# Patient Record
Sex: Male | Born: 1945 | Race: White | Hispanic: No | Marital: Married | Smoking: Never smoker
Health system: Southern US, Community
[De-identification: ages and names within clinical notes are randomized; demographics above are authoritative.]

## PROBLEM LIST (undated history)

## (undated) DIAGNOSIS — E1140 Type 2 diabetes mellitus with diabetic neuropathy, unspecified: Secondary | ICD-10-CM

## (undated) DIAGNOSIS — I739 Peripheral vascular disease, unspecified: Secondary | ICD-10-CM

## (undated) DIAGNOSIS — I10 Essential (primary) hypertension: Secondary | ICD-10-CM

## (undated) DIAGNOSIS — Z952 Presence of prosthetic heart valve: Secondary | ICD-10-CM

## (undated) DIAGNOSIS — M109 Gout, unspecified: Secondary | ICD-10-CM

## (undated) DIAGNOSIS — I447 Left bundle-branch block, unspecified: Secondary | ICD-10-CM

## (undated) DIAGNOSIS — I712 Thoracic aortic aneurysm, without rupture, unspecified: Secondary | ICD-10-CM

## (undated) DIAGNOSIS — E785 Hyperlipidemia, unspecified: Secondary | ICD-10-CM

## (undated) DIAGNOSIS — E11319 Type 2 diabetes mellitus with unspecified diabetic retinopathy without macular edema: Secondary | ICD-10-CM

## (undated) DIAGNOSIS — C189 Malignant neoplasm of colon, unspecified: Secondary | ICD-10-CM

## (undated) DIAGNOSIS — E611 Iron deficiency: Secondary | ICD-10-CM

## (undated) DIAGNOSIS — I779 Disorder of arteries and arterioles, unspecified: Secondary | ICD-10-CM

## (undated) DIAGNOSIS — I629 Nontraumatic intracranial hemorrhage, unspecified: Secondary | ICD-10-CM

## (undated) DIAGNOSIS — N189 Chronic kidney disease, unspecified: Secondary | ICD-10-CM

## (undated) DIAGNOSIS — E119 Type 2 diabetes mellitus without complications: Secondary | ICD-10-CM

## (undated) DIAGNOSIS — N200 Calculus of kidney: Secondary | ICD-10-CM

## (undated) DIAGNOSIS — D509 Iron deficiency anemia, unspecified: Secondary | ICD-10-CM

## (undated) DIAGNOSIS — N289 Disorder of kidney and ureter, unspecified: Secondary | ICD-10-CM

## (undated) DIAGNOSIS — R55 Syncope and collapse: Secondary | ICD-10-CM

## (undated) HISTORY — PX: CARDIAC VALVE REPLACEMENT: SHX585

## (undated) HISTORY — DX: Iron deficiency anemia, unspecified: D50.9

## (undated) HISTORY — DX: Type 2 diabetes mellitus with unspecified diabetic retinopathy without macular edema: E11.319

## (undated) HISTORY — DX: Syncope and collapse: R55

## (undated) HISTORY — DX: Chronic kidney disease, unspecified: N18.9

## (undated) HISTORY — DX: Disorder of arteries and arterioles, unspecified: I77.9

## (undated) HISTORY — DX: Left bundle-branch block, unspecified: I44.7

## (undated) HISTORY — DX: Thoracic aortic aneurysm, without rupture: I71.2

## (undated) HISTORY — DX: Nontraumatic intracranial hemorrhage, unspecified: I62.9

## (undated) HISTORY — DX: Hyperlipidemia, unspecified: E78.5

## (undated) HISTORY — DX: Thoracic aortic aneurysm, without rupture, unspecified: I71.20

## (undated) HISTORY — DX: Calculus of kidney: N20.0

## (undated) HISTORY — PX: EYE SURGERY: SHX253

## (undated) HISTORY — DX: Malignant neoplasm of colon, unspecified: C18.9

## (undated) HISTORY — PX: PARTIAL COLECTOMY: SHX5273

## (undated) HISTORY — DX: Type 2 diabetes mellitus with diabetic neuropathy, unspecified: E11.40

## (undated) HISTORY — DX: Peripheral vascular disease, unspecified: I73.9

---

## 2013-05-23 ENCOUNTER — Emergency Department: Payer: Self-pay | Admitting: Emergency Medicine

## 2013-05-23 LAB — BASIC METABOLIC PANEL
Chloride: 105 mmol/L (ref 98–107)
EGFR (Non-African Amer.): 30 — ABNORMAL LOW
Sodium: 138 mmol/L (ref 136–145)

## 2013-05-23 LAB — TROPONIN I
Troponin-I: <0.02 ng/mL
Troponin-I: <0.02 ng/mL

## 2013-05-23 LAB — CBC
HGB: 12.6 g/dL — ABNORMAL LOW (ref 13.0–18.0)
MCH: 30.6 pg (ref 26.0–34.0)
MCV: 91 fL (ref 80–100)
Platelet: 256 10*3/uL (ref 150–440)
RDW: 14.3 % (ref 11.5–14.5)
WBC: 9 10*3/uL (ref 3.8–10.6)

## 2013-05-23 LAB — PROTIME-INR
INR: 2.1
Prothrombin Time: 23.5 secs — ABNORMAL HIGH (ref 11.5–14.7)

## 2015-03-27 ENCOUNTER — Emergency Department: Payer: Non-veteran care

## 2015-03-27 ENCOUNTER — Emergency Department
Admission: EM | Admit: 2015-03-27 | Discharge: 2015-03-27 | Disposition: A | Discharge status: Home / Self Care | Payer: Non-veteran care | Attending: Emergency Medicine | Admitting: Emergency Medicine

## 2015-03-27 DIAGNOSIS — R590 Localized enlarged lymph nodes: Secondary | ICD-10-CM | POA: Diagnosis not present

## 2015-03-27 DIAGNOSIS — Z7901 Long term (current) use of anticoagulants: Secondary | ICD-10-CM | POA: Diagnosis not present

## 2015-03-27 DIAGNOSIS — E119 Type 2 diabetes mellitus without complications: Secondary | ICD-10-CM | POA: Insufficient documentation

## 2015-03-27 DIAGNOSIS — I10 Essential (primary) hypertension: Secondary | ICD-10-CM | POA: Insufficient documentation

## 2015-03-27 DIAGNOSIS — Z792 Long term (current) use of antibiotics: Secondary | ICD-10-CM | POA: Diagnosis not present

## 2015-03-27 DIAGNOSIS — Z79899 Other long term (current) drug therapy: Secondary | ICD-10-CM | POA: Diagnosis not present

## 2015-03-27 DIAGNOSIS — R1032 Left lower quadrant pain: Secondary | ICD-10-CM | POA: Diagnosis present

## 2015-03-27 HISTORY — DX: Type 2 diabetes mellitus without complications: E11.9

## 2015-03-27 HISTORY — DX: Essential (primary) hypertension: I10

## 2015-03-27 HISTORY — DX: Iron deficiency: E61.1

## 2015-03-27 MED ORDER — KETOCONAZOLE 2 % EX CREA: 1.0000 "application " | TOPICAL_CREAM | Freq: Every day | CUTANEOUS | Status: DC

## 2015-03-27 MED ORDER — CLINDAMYCIN HCL 300 MG PO CAPS: 300.0000 mg | ORAL_CAPSULE | Freq: Three times a day (TID) | ORAL | Status: DC

## 2015-03-27 NOTE — Discharge Instructions (Signed)

## 2015-03-27 NOTE — ED Notes (Signed)
States he noticed a lump to inside left groin area a couple of weeks ago. Area is hard and red

## 2015-03-27 NOTE — ED Provider Notes (Signed)
[facility] Emergency Department Provider Note  ____________________________________________  Time seen: Approximately 4:38 PM  I have reviewed the triage vital signs and the nursing notes.   HISTORY  Chief Complaint Groin Pain    HPI Billy Henderson is a 69 y.o. male who presents for a noticeable lump on the inside of his left groin. Patient states that the area is red, hard. Currently on Coumadin and worried about a bleed. Unable to get into the [place] at this time. Pain increased with walking and pressure.   Past Medical History  Diagnosis Date  . Diabetes mellitus without complication   . Hypertension   . Low iron     There are no active problems to display for this patient.   Past Surgical History  Procedure Laterality Date  . Cardiac valve replacement      Current Outpatient Rx  Name  Route  Sig  Dispense  Refill  . allopurinol (ZYLOPRIM) 100 MG tablet   Oral   Take 100 mg by mouth daily.         [name] amLODipine (NORVASC) 2.5 MG tablet   Oral   Take 2.5 mg by mouth daily.         . furosemide (LASIX) 40 MG tablet   Oral   Take 40 mg by mouth.         . losartan (COZAAR) 50 MG tablet   Oral   Take 50 mg by mouth daily.         . simvastatin (ZOCOR) 40 MG tablet   Oral   Take 40 mg by mouth daily.         [name] warfarin (COUMADIN) 4 MG tablet   Oral   Take 4 mg by mouth daily.         . clindamycin (CLEOCIN) 300 MG capsule   Oral   Take 1 capsule (300 mg total) by mouth 3 (three) times daily.   30 capsule   0   . ketoconazole (NIZORAL) 2 % cream   Topical   Apply 1 application topically daily.   30 g   0     Allergies Review of patient's allergies indicates no known allergies.  No family history on file.  Social History History  Substance Use Topics  . Smoking status: Never Smoker   . Smokeless tobacco: Not on file  . Alcohol Use: No    Review of Systems Constitutional: No fever/chills Eyes: No  visual changes. ENT: No sore throat. Cardiovascular: Denies chest pain. Respiratory: Denies shortness of breath. Gastrointestinal: No abdominal pain.  No nausea, no vomiting.  No diarrhea.  No constipation. Genitourinary: Negative for dysuria. Positive for left inguinal groin swelling 2 x 7 cm. Musculoskeletal: Negative for back pain. Skin: Negative for rash. Neurological: Negative for headaches, focal weakness or numbness.  10-point ROS otherwise negative.  ____________________________________________   PHYSICAL EXAM:  VITAL SIGNS: ED Triage Vitals  Enc Vitals Group     BP 03/27/15 1608 131/58 mmHg     Pulse Rate 03/27/15 1608 74     Resp 03/27/15 1608 16     Temp 03/27/15 1608 98.7 F (37.1 C)     Temp Source 03/27/15 1608 Oral     SpO2 03/27/15 1608 99 %     Weight 03/27/15 1608 164 lb (74.39 kg)     Height 03/27/15 1608 5\' 6"  (1.676 m)     Head Cir --      Peak Flow --  Pain Score 03/27/15 1614 2     Pain Loc --      Pain Edu? --      Excl. in [place]? --     Constitutional: Alert and oriented. Well appearing and in no acute distress. Eyes: Conjunctivae are normal. PERRL. EOMI. Head: Atraumatic. Nose: No congestion/rhinnorhea. Mouth/Throat: Mucous membranes are moist.  Oropharynx non-erythematous. Neck: No stridor.   Cardiovascular: Normal rate, regular rhythm. Grossly normal heart sounds.  Good peripheral circulation. Respiratory: Normal respiratory effort.  No retractions. Lungs CTAB. Gastrointestinal: Soft and nontender. No distention. No abdominal bruits. No CVA tenderness. Genitourinary: Inguinal groin mass approximately 2 x 7 cm. Musculoskeletal: No lower extremity tenderness nor edema.  No joint effusions. Neurologic:  Normal speech and language. No gross focal neurologic deficits are appreciated. Speech is normal. No gait instability. Skin:  Skin is warm, dry and intact. No rash noted. Psychiatric: Mood and affect are normal. Speech and behavior are  normal.  ____________________________________________   LABS (all labs ordered are listed, but only abnormal results are displayed)  Labs Reviewed - No data to display ____________________________________________  RADIOLOGY ultrasound lower extremity limited left inguinal area. Interpreted by radiologist and reviewed by myself, positive for for left inguinal lymphadenopathy. No abscess noted. ____________________________________________   PROCEDURES  Procedure(s) performed: None  Critical Care performed: No  ____________________________________________   INITIAL IMPRESSION / ASSESSMENT AND PLAN / ED COURSE  Pertinent labs & imaging results that were available during my care of the patient were reviewed by me and considered in my medical decision making (see chart for details).  As 100% disabled veteran will refer back to the [place] for primary care follow-up.. Otherwise follow up with Rx given for clindamycin 300 mg 3 times a day due to the interaction between Bactrim and warfarin. ____________________________________________   FINAL CLINICAL IMPRESSION(S) / ED DIAGNOSES  Final diagnoses:  Inguinal lymphadenopathy      CYNCERE MUNN, PA-C 03/27/15 [place], MD 03/27/15 1945

## 2015-03-27 NOTE — ED Notes (Addendum)
Pt states that he noticed a lump to inner left groin X 2 weeks; painful with walking. Wife states that it is reddened, hard and tender to touch; possible abscess.

## 2015-06-10 ENCOUNTER — Emergency Department: Payer: Non-veteran care

## 2015-06-10 ENCOUNTER — Observation Stay
Admission: EM | Admit: 2015-06-10 | Discharge: 2015-06-11 | Disposition: A | Discharge status: Home / Self Care | Payer: Non-veteran care | Attending: Internal Medicine | Admitting: Internal Medicine

## 2015-06-10 ENCOUNTER — Encounter: Payer: Self-pay | Admitting: Emergency Medicine

## 2015-06-10 DIAGNOSIS — D72829 Elevated white blood cell count, unspecified: Secondary | ICD-10-CM | POA: Diagnosis not present

## 2015-06-10 DIAGNOSIS — Z833 Family history of diabetes mellitus: Secondary | ICD-10-CM | POA: Diagnosis not present

## 2015-06-10 DIAGNOSIS — I10 Essential (primary) hypertension: Secondary | ICD-10-CM | POA: Diagnosis present

## 2015-06-10 DIAGNOSIS — Z794 Long term (current) use of insulin: Secondary | ICD-10-CM | POA: Diagnosis not present

## 2015-06-10 DIAGNOSIS — R112 Nausea with vomiting, unspecified: Secondary | ICD-10-CM | POA: Diagnosis present

## 2015-06-10 DIAGNOSIS — Z79899 Other long term (current) drug therapy: Secondary | ICD-10-CM | POA: Insufficient documentation

## 2015-06-10 DIAGNOSIS — N183 Chronic kidney disease, stage 3 unspecified: Secondary | ICD-10-CM | POA: Diagnosis present

## 2015-06-10 DIAGNOSIS — I712 Thoracic aortic aneurysm, without rupture: Secondary | ICD-10-CM | POA: Insufficient documentation

## 2015-06-10 DIAGNOSIS — M109 Gout, unspecified: Secondary | ICD-10-CM | POA: Insufficient documentation

## 2015-06-10 DIAGNOSIS — I129 Hypertensive chronic kidney disease with stage 1 through stage 4 chronic kidney disease, or unspecified chronic kidney disease: Secondary | ICD-10-CM | POA: Insufficient documentation

## 2015-06-10 DIAGNOSIS — R590 Localized enlarged lymph nodes: Secondary | ICD-10-CM | POA: Diagnosis not present

## 2015-06-10 DIAGNOSIS — E1122 Type 2 diabetes mellitus with diabetic chronic kidney disease: Secondary | ICD-10-CM | POA: Diagnosis not present

## 2015-06-10 DIAGNOSIS — Z7901 Long term (current) use of anticoagulants: Secondary | ICD-10-CM | POA: Insufficient documentation

## 2015-06-10 DIAGNOSIS — Z952 Presence of prosthetic heart valve: Secondary | ICD-10-CM | POA: Insufficient documentation

## 2015-06-10 DIAGNOSIS — I2510 Atherosclerotic heart disease of native coronary artery without angina pectoris: Secondary | ICD-10-CM | POA: Insufficient documentation

## 2015-06-10 DIAGNOSIS — R1031 Right lower quadrant pain: Secondary | ICD-10-CM | POA: Insufficient documentation

## 2015-06-10 DIAGNOSIS — E119 Type 2 diabetes mellitus without complications: Secondary | ICD-10-CM

## 2015-06-10 HISTORY — DX: Gout, unspecified: M10.9

## 2015-06-10 HISTORY — DX: Disorder of kidney and ureter, unspecified: N28.9

## 2015-06-10 LAB — COMPREHENSIVE METABOLIC PANEL
ALT: 16 U/L — ABNORMAL LOW (ref 17–63)
AST: 25 U/L (ref 15–41)
Albumin: 4.3 g/dL (ref 3.5–5.0)
Alkaline Phosphatase: 138 U/L — ABNORMAL HIGH (ref 38–126)
Anion gap: 8 (ref 5–15)
BILIRUBIN TOTAL: 1.1 mg/dL (ref 0.3–1.2)
BUN: 47 mg/dL — ABNORMAL HIGH (ref 6–20)
CHLORIDE: 100 mmol/L — AB (ref 101–111)
CO2: 25 mmol/L (ref 22–32)
Calcium: 9.8 mg/dL (ref 8.9–10.3)
Creatinine, Ser: 1.98 mg/dL — ABNORMAL HIGH (ref 0.61–1.24)
GFR calc non Af Amer: 32 mL/min — ABNORMAL LOW (ref >60)
GFR, EST AFRICAN AMERICAN: 37 mL/min — AB (ref >60)
Glucose, Bld: 380 mg/dL — ABNORMAL HIGH (ref 65–99)
POTASSIUM: 4.9 mmol/L (ref 3.5–5.1)
Sodium: 133 mmol/L — ABNORMAL LOW (ref 135–145)
TOTAL PROTEIN: 7.6 g/dL (ref 6.5–8.1)

## 2015-06-10 LAB — CBC
HCT: 37.3 % — ABNORMAL LOW (ref 40.0–52.0)
Hemoglobin: 12.5 g/dL — ABNORMAL LOW (ref 13.0–18.0)
MCH: 30.6 pg (ref 26.0–34.0)
MCHC: 33.4 g/dL (ref 32.0–36.0)
MCV: 91.4 fL (ref 80.0–100.0)
PLATELETS: 229 10*3/uL (ref 150–440)
RBC: 4.08 MIL/uL — ABNORMAL LOW (ref 4.40–5.90)
RDW: 14.5 % (ref 11.5–14.5)
WBC: 11.4 10*3/uL — AB (ref 3.8–10.6)

## 2015-06-10 LAB — URINALYSIS COMPLETE WITH MICROSCOPIC (ARMC ONLY)
BILIRUBIN URINE: NEGATIVE
Bacteria, UA: NONE SEEN
Glucose, UA: >500 mg/dL — AB
KETONES UR: NEGATIVE mg/dL
LEUKOCYTES UA: NEGATIVE
Nitrite: NEGATIVE
PH: 6 (ref 5.0–8.0)
PROTEIN: 100 mg/dL — AB
SPECIFIC GRAVITY, URINE: 1.016 (ref 1.005–1.030)

## 2015-06-10 LAB — PROTIME-INR
INR: 3.27
Prothrombin Time: 33.3 seconds — ABNORMAL HIGH (ref 11.4–15.0)

## 2015-06-10 MED ORDER — HYDROMORPHONE HCL 1 MG/ML IJ SOLN: 0.5000 mg | INTRAMUSCULAR | Status: DC | PRN

## 2015-06-10 MED ORDER — METOCLOPRAMIDE HCL 5 MG/ML IJ SOLN
5.0000 mg | Freq: Once | INTRAMUSCULAR | Status: AC
  Administered 2015-06-10: 5 mg via INTRAVENOUS
  Filled 2015-06-10: qty 2
  Filled 2015-06-10: qty 1

## 2015-06-10 MED ORDER — ONDANSETRON 4 MG PO TBDP
4.0000 mg | ORAL_TABLET | Freq: Once | ORAL | Status: AC | PRN
  Administered 2015-06-10: 4 mg via ORAL

## 2015-06-10 MED ORDER — HYDROMORPHONE HCL 1 MG/ML IJ SOLN
INTRAMUSCULAR | Status: AC
  Filled 2015-06-10: qty 1

## 2015-06-10 MED ORDER — ONDANSETRON HCL 4 MG/2ML IJ SOLN: 4.0000 mg | Freq: Once | INTRAMUSCULAR | Status: DC

## 2015-06-10 MED ORDER — IOHEXOL 240 MG/ML SOLN
25.0000 mL | INTRAMUSCULAR | Status: AC
  Administered 2015-06-10: 25 mL via ORAL
  Filled 2015-06-10: qty 25

## 2015-06-10 MED ORDER — ONDANSETRON 4 MG PO TBDP
ORAL_TABLET | ORAL | Status: AC
  Administered 2015-06-10: 4 mg via ORAL
  Filled 2015-06-10: qty 1

## 2015-06-10 MED ORDER — METOCLOPRAMIDE HCL 5 MG/ML IJ SOLN
5.0000 mg | Freq: Once | INTRAMUSCULAR | Status: AC
  Administered 2015-06-10: 5 mg via INTRAVENOUS
  Filled 2015-06-10: qty 1

## 2015-06-10 MED ORDER — ONDANSETRON 4 MG PO TBDP
ORAL_TABLET | ORAL | Status: AC
  Filled 2015-06-10: qty 1

## 2015-06-10 MED ORDER — SODIUM CHLORIDE 0.9 % IV BOLUS (SEPSIS)
500.0000 mL | Freq: Once | INTRAVENOUS | Status: AC
  Administered 2015-06-10: 500 mL via INTRAVENOUS

## 2015-06-10 NOTE — H&P (Signed)
[facility] [place] NAME: Kaiyu Osen    MR#:  [id number]  DATE OF BIRTH:  [birth year]  DATE OF ADMISSION:  06/10/2015  PRIMARY CARE PHYSICIAN: Pcp Not In System   REQUESTING/REFERRING PHYSICIAN: Thomasene Lot, MD  CHIEF COMPLAINT:   Chief Complaint  Patient presents with  . Flank Pain    HISTORY OF PRESENT ILLNESS:  Ras Swindell  is a 69 y.o. male who presents with right flank pain and intractable nausea and vomiting. Patient states that he developed right flank pain one day prior to admission. Shortly thereafter he developed nausea and vomiting and has been unable to control it since that time. He denies any hematemesis. Denies any diarrhea. He denies fever/chills, or any other overt infectious symptomology. No recent sick contacts, and no recent suspicious food ingestion. On evaluation in the ED workup is largely stable, CT abdomen and pelvis did not show any abnormalities except for one periaortic node. Despite multiple rounds of antiemetic administration he still has significant nausea. Hospitalists were called for admission for intractable nausea and vomiting  PAST MEDICAL HISTORY:   Past Medical History  Diagnosis Date  . Diabetes mellitus without complication   . Hypertension   . Low iron   . Gout   . Kidney function abnormal     PAST SURGICAL HISTORY:   Past Surgical History  Procedure Laterality Date  . Cardiac valve replacement    . Eye surgery Right     Diabetic retinopathy     SOCIAL HISTORY:   Social History  Substance Use Topics  . Smoking status: Never Smoker   . Smokeless tobacco: Not on file  . Alcohol Use: No    FAMILY HISTORY:   Family History  Problem Relation Age of Onset  . Diabetes      DRUG ALLERGIES:  No Known Allergies  MEDICATIONS AT HOME:   Prior to Admission medications   Medication Sig Start Date End Date Taking? Authorizing Provider  warfarin (COUMADIN) 5 MG tablet  Take 5 mg by mouth daily. Tuesday, Thursday, saturday   Yes Historical Provider, MD  allopurinol (ZYLOPRIM) 100 MG tablet Take 100 mg by mouth daily.    Historical Provider, MD  amLODipine (NORVASC) 2.5 MG tablet Take 5 mg by mouth daily.     Historical Provider, MD  furosemide (LASIX) 40 MG tablet Take 40 mg by mouth.    Historical Provider, MD  losartan (COZAAR) 50 MG tablet Take 50 mg by mouth daily.    Historical Provider, MD  simvastatin (ZOCOR) 40 MG tablet Take 40 mg by mouth daily.    Historical Provider, MD  warfarin (COUMADIN) 4 MG tablet Take 4 mg by mouth daily. Monday, Wednesdays, Fridays, and Sundays    Historical Provider, MD    REVIEW OF SYSTEMS:  Review of Systems  Constitutional: Negative for fever, chills, weight loss and malaise/fatigue.  HENT: Negative for ear pain, hearing loss and tinnitus.   Eyes: Negative for blurred vision, double vision, pain and redness.  Respiratory: Negative for cough, hemoptysis and shortness of breath.   Cardiovascular: Negative for chest pain, palpitations, orthopnea and leg swelling.  Gastrointestinal: Positive for nausea and vomiting. Negative for abdominal pain, diarrhea and constipation.  Genitourinary: Positive for flank pain (right). Negative for dysuria, frequency and hematuria.  Musculoskeletal: Negative for back pain, joint pain and neck pain.  Skin:       No acne, rash, or lesions  Neurological: Negative for dizziness, tremors, focal  weakness and weakness.  Endo/Heme/Allergies: Negative for polydipsia. Does not bruise/bleed easily.  Psychiatric/Behavioral: Negative for depression. The patient is not nervous/anxious and does not have insomnia.      VITAL SIGNS:   Filed Vitals:   06/10/15 1818 06/10/15 2222  BP: 161/76 182/84  Pulse: 92 92  Temp: 98.3 F (36.8 C) 97.9 F (36.6 C)  TempSrc: Oral Oral  Resp: 18 18  Height: 5\' 6"  (1.676 m)   Weight: 74.844 kg (165 lb)   SpO2: 98% 99%   Wt Readings from Last 3 Encounters:   06/10/15 74.844 kg (165 lb)  03/27/15 74.39 kg (164 lb)    PHYSICAL EXAMINATION:  Physical Exam  Vitals reviewed. Constitutional: He is oriented to person, place, and time. He appears well-developed and well-nourished. No distress.  HENT:  Head: Normocephalic and atraumatic.  Mouth/Throat: Oropharynx is clear and moist.  Eyes: Conjunctivae and EOM are normal. Pupils are equal, round, and reactive to light. No scleral icterus.  Neck: Normal range of motion. Neck supple. No JVD present. No thyromegaly present.  Cardiovascular: Normal rate, regular rhythm and intact distal pulses.  Exam reveals no gallop and no friction rub.   No murmur heard. Respiratory: Effort normal and breath sounds normal. No respiratory distress. He has no wheezes. He has no rales.  GI: Soft. Bowel sounds are normal. He exhibits no distension. There is tenderness (Very mild right flank pain).  Musculoskeletal: Normal range of motion. He exhibits no edema.  No arthritis, no gout  Lymphadenopathy:    He has no cervical adenopathy.  Neurological: He is alert and oriented to person, place, and time. No cranial nerve deficit.  No dysarthria, no aphasia  Skin: Skin is warm and dry. No rash noted. No erythema.  Psychiatric: He has a normal mood and affect. His behavior is normal. Judgment and thought content normal.    LABORATORY PANEL:   CBC  Recent Labs Lab 06/10/15 1824  WBC 11.4*  HGB 12.5*  HCT 37.3*  PLT 229   ------------------------------------------------------------------------------------------------------------------  Chemistries   Recent Labs Lab 06/10/15 1824  NA 133*  K 4.9  CL 100*  CO2 25  GLUCOSE 380*  BUN 47*  CREATININE 1.98*  CALCIUM 9.8  AST 25  ALT 16*  ALKPHOS 138*  BILITOT 1.1   ------------------------------------------------------------------------------------------------------------------  Cardiac Enzymes No results for input(s): TROPONINI in the last 168  hours. ------------------------------------------------------------------------------------------------------------------  RADIOLOGY:  Ct Abdomen Pelvis Wo Contrast  06/10/2015   CLINICAL DATA:  Sudden onset right flank pain with nausea and vomiting beginning this morning. No history of kidney stones.  EXAM: CT ABDOMEN AND PELVIS WITHOUT CONTRAST  TECHNIQUE: Multidetector CT imaging of the abdomen and pelvis was performed following the standard protocol without IV contrast.  COMPARISON:  None.  FINDINGS: Lung bases are within normal. There is calcification of the mitral valve annulus.  Abdominal images demonstrate a normal liver, spleen, gallbladder, pancreas and adrenal glands. Kidneys are normal size without hydronephrosis or nephrolithiasis. Subcentimeter left renal cortical hypodensity too small to characterize but likely a cyst. Ureters are normal. Appendix is normal.  There is moderate calcified plaque over the abdominal aorta and iliac arteries.  There is a prominent 2.3 cm periaortic lymph node when measured by short axis. Few smaller periaortic and mesenteric lymph nodes present. There is no free fluid or focal inflammatory change.  Mild fecal retention throughout the colon. The colon and small bowel are otherwise within normal.  Pelvic images demonstrate mild prominence of the prostate with  impression upon the bladder base. Bladder is within normal. Rectum is normal. No free fluid or adenopathy in the pelvis.  Mild degenerative change of the spine and hips. Multi level disc disease throughout the lumbar spine. Several well-defined hypodense lesions within the vertebral bodies likely hemangiomas. 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  IMPRESSION: No acute findings in the abdomen/pelvis.  Mild periaortic adenopathy with lymph node measuring 2.3 cm by short axis. This is concerning for metastatic disease as recommend clinical correlation and further imaging as clinically indicated  as chest CT may be helpful prior to consideration of PET-CT.  Subcentimeter left renal cortical hypodensity too small to characterize but likely a cyst.   Electronically Signed   By: Marin Olp M.D.   On: 06/10/2015 21:39    EKG:   Orders placed or performed in visit on 05/23/13  . EKG 12-Lead    IMPRESSION AND PLAN:  Principal Problem:   Intractable nausea and vomiting - we'll continue IV antiemetics, alternating between Zofran and Phenergan as necessary. Nothing by mouth, IV fluids for now. Active Problems:   Leukocytosis - barely elevated, unclear if early reaction to some infection or simply a small hemoconcentration. IV fluids tonight and reassess in the morning.   CKD (chronic kidney disease), stage III - creatinine slightly improved from prior value. Avoid nephrotoxins, monitor serum creatinine level.   Hypertension - elevated here, in the setting of significant nausea and vomiting. Continue home antihypertensives, IV when necessary antihypertensives while here.   Type 2 diabetes mellitus - blood glucose level elevated. Sliding scale insulin and fingerstick glucose checks every 6 hours for now while nothing by mouth.  All the records are reviewed and case discussed with ED provider. Management plans discussed with the patient and/or family.  DVT PROPHYLAXIS: Systemic anticoagulation - home dose warfarin  ADMISSION STATUS: Observation  CODE STATUS: Full  TOTAL TIME TAKING CARE OF THIS PATIENT: 45 minutes.    WILLIS, DAVID Selmer 06/10/2015, 11:16 PM  [facility]  Office  [phone]  CC: Primary care physician; Pcp Not In System

## 2015-06-10 NOTE — ED Notes (Signed)
Patient to ER for right sided flank pain with N/V. Denies any known history of kidney stones. Denies dysuria.

## 2015-06-10 NOTE — ED Provider Notes (Signed)
[facility] Emergency Department Provider Note  ____________________________________________  Time seen: 1915  I have reviewed the triage vital signs and the nursing notes.   HISTORY  Chief Complaint Flank Pain  right-sided abdominal pain Nausea, vomiting    HPI Billy Henderson is a 69 y.o. male who is been experiencing right-sided abdominal pain beginning earlier today. He reports nausea with vomiting. He has a history of renal insufficiency but denies a history of renal stones. He has a history of a heart valve transplant.  The patient has been treated by protocol with Zofran prior to my examination. He reports he has ongoing nausea still. He has had normal bowel function without diarrhea. His last bowel movement was yesterday and he has had normal flatulence today.   Past Medical History  Diagnosis Date  . Diabetes mellitus without complication   . Hypertension   . Low iron   . Gout   . Kidney function abnormal     There are no active problems to display for this patient.   Past Surgical History  Procedure Laterality Date  . Cardiac valve replacement    . Eye surgery Right     Diabetic retinopathy     Current Outpatient Rx  Name  Route  Sig  Dispense  Refill  . allopurinol (ZYLOPRIM) 100 MG tablet   Oral   Take 100 mg by mouth daily.         [name] amLODipine (NORVASC) 2.5 MG tablet   Oral   Take 2.5 mg by mouth daily.         . clindamycin (CLEOCIN) 300 MG capsule   Oral   Take 1 capsule (300 mg total) by mouth 3 (three) times daily.   30 capsule   0   . furosemide (LASIX) 40 MG tablet   Oral   Take 40 mg by mouth.         [name] ketoconazole (NIZORAL) 2 % cream   Topical   Apply 1 application topically daily.   30 g   0   . losartan (COZAAR) 50 MG tablet   Oral   Take 50 mg by mouth daily.         . simvastatin (ZOCOR) 40 MG tablet   Oral   Take 40 mg by mouth daily.         [name] warfarin (COUMADIN) 4 MG tablet  Oral   Take 4 mg by mouth daily.           Allergies Review of patient's allergies indicates no known allergies.  No family history on file.  Social History Social History  Substance Use Topics  . Smoking status: Never Smoker   . Smokeless tobacco: None  . Alcohol Use: No    Review of Systems  Constitutional: Negative for fever. ENT: Negative for sore throat. Cardiovascular: Negative for chest pain. Noted history of valve for placement. Respiratory: Negative for shortness of breath. Gastrointestinal: Nausea and vomiting with abdominal pain. No diarrhea. See history of present illness Genitourinary: Negative for dysuria. Musculoskeletal: No myalgias or injuries. Skin: Negative for rash. Neurological: Negative for headaches   10-point ROS otherwise negative.  ____________________________________________   PHYSICAL EXAM:  VITAL SIGNS: ED Triage Vitals  Enc Vitals Group     BP 06/10/15 1818 161/76 mmHg     Pulse Rate 06/10/15 1818 92     Resp 06/10/15 1818 18     Temp 06/10/15 1818 98.3 F (36.8 C)     Temp Source  06/10/15 1818 Oral     SpO2 06/10/15 1818 98 %     Weight 06/10/15 1818 165 lb (74.844 kg)     Height 06/10/15 1818 5\' 6"  (1.676 m)     Head Cir --      Peak Flow --      Pain Score 06/10/15 1827 8     Pain Loc --      Pain Edu? --      Excl. in [place]? --     Constitutional:  Alert and oriented. Patient appears uncomfortable but in no acute distress.[name] ENT   Head: Normocephalic and atraumatic.   Nose: No congestion/rhinnorhea. Cardiovascular: Normal rate, regular rhythm, loud mechanical murmur noted consistent with valve replacement Respiratory:  Normal respiratory effort, no tachypnea.    Breath sounds are clear and equal bilaterally.  Gastrointestinal: Soft. Minimal discomfort on palpation with no guarding, no rebound, no distention.  Back: No muscle spasm, no tenderness, no CVA tenderness. Musculoskeletal: No deformity noted. Nontender  with normal range of motion in all extremities.  No noted edema. Neurologic:  Normal speech and language. No gross focal neurologic deficits are appreciated.  Skin:  Skin is warm, dry. No rash noted. Psychiatric: Mood and affect are normal. Speech and behavior are normal.  ____________________________________________    LABS (pertinent positives/negatives)  Labs Reviewed  CBC - Abnormal; Notable for the following:    WBC 11.4 (*)    RBC 4.08 (*)    Hemoglobin 12.5 (*)    HCT 37.3 (*)    All other components within normal limits  URINALYSIS COMPLETEWITH MICROSCOPIC ([facility] ONLY) - Abnormal; Notable for the following:    Color, Urine STRAW (*)    APPearance CLEAR (*)    Glucose, UA >500 (*)    Hgb urine dipstick 1+ (*)    Protein, ur 100 (*)    Squamous Epithelial / LPF 0-5 (*)    All other components within normal limits  COMPREHENSIVE METABOLIC PANEL - Abnormal; Notable for the following:    Sodium 133 (*)    Chloride 100 (*)    Glucose, Bld 380 (*)    BUN 47 (*)    Creatinine, Ser 1.98 (*)    ALT 16 (*)    Alkaline Phosphatase 138 (*)    GFR calc non Af Amer 32 (*)    GFR calc Af Amer 37 (*)    All other components within normal limits     ____________________________________________    RADIOLOGY  CT abdomen and pelvis without IV contrast:  IMPRESSION: No acute findings in the abdomen/pelvis.  Mild periaortic adenopathy with lymph node measuring 2.3 cm by short axis. This is concerning for metastatic disease as recommend clinical correlation and further imaging as clinically indicated as chest CT may be helpful prior to consideration of PET-CT.  Subcentimeter left renal cortical hypodensity too small to characterize but likely a cyst.  ____________________________________________   INITIAL IMPRESSION / ASSESSMENT AND PLAN / ED COURSE  Pertinent labs & imaging results that were available during my care of the patient were reviewed by me and considered in my  medical decision making (see chart for details).  Pleasant alert 69 year old male. He looks little bit uncomfortable, little bit cold. He has a mild tremor. He does not have the agitated look of someone with a renal stone. He has minimal tenderness on exam. The cause of his nausea, vomiting, and abdominal discomfort is unclear at this time. We will obtain a CT scan without IV  contrast due to his noted renal function. This is fairly similar to his renal function in 2014.  We will treat his pain with Dilaudid and now treat him with Reglan for his nausea as Zofran does not appear to be working very well for him. He'll receive a 500 mL bolus of normal saline IV.  ----------------------------------------- 9:54 PM on 06/10/2015 -----------------------------------------  The patient's CT abdomen and pelvis does not show any acute pathology. There is a common to about periaortic adenopathy, mild, that could be concerning for metastatic disease. The patient may need to have a CT scan of his chest for further evaluation.  The patient is continuing to have some nausea and discomfort. His wife is coming to me that for additional nausea medicine. This will be his fourth dose of antinausea medicine. Due to this ongoing discomfort despite IV treatment, I will speak with the hospitalist for a mission to the hospital, ongoing treatment, and consideration for further imaging tomorrow.  ----------------------------------------- 10:14 PM on 06/10/2015 -----------------------------------------  Patient continues to look slightly uncomfortable. He agrees to stay in the hospital due to his refractory nausea and vomiting. I have ordered a CT chest for further evaluation as suggested by radiology. I discussed the case with Dr. Jannifer Franklin for admission hospital. He will evaluate the patient in the ED.   ____________________________________________   FINAL CLINICAL IMPRESSION(S) / ED DIAGNOSES  Final diagnoses:  Right  lower quadrant abdominal pain  Refractory nausea and vomiting      Ahmed Prima, MD 06/10/15 2256

## 2015-06-11 LAB — BASIC METABOLIC PANEL
ANION GAP: 8 (ref 5–15)
BUN: 39 mg/dL — AB (ref 6–20)
CO2: 24 mmol/L (ref 22–32)
Calcium: 9.5 mg/dL (ref 8.9–10.3)
Chloride: 107 mmol/L (ref 101–111)
Creatinine, Ser: 1.78 mg/dL — ABNORMAL HIGH (ref 0.61–1.24)
GFR, EST AFRICAN AMERICAN: 43 mL/min — AB (ref >60)
GFR, EST NON AFRICAN AMERICAN: 37 mL/min — AB (ref >60)
Glucose, Bld: 182 mg/dL — ABNORMAL HIGH (ref 65–99)
POTASSIUM: 4.2 mmol/L (ref 3.5–5.1)
SODIUM: 139 mmol/L (ref 135–145)

## 2015-06-11 LAB — PROTIME-INR
INR: 3.29
Prothrombin Time: 33.5 seconds — ABNORMAL HIGH (ref 11.4–15.0)

## 2015-06-11 LAB — CBC
HEMATOCRIT: 34.8 % — AB (ref 40.0–52.0)
Hemoglobin: 11.7 g/dL — ABNORMAL LOW (ref 13.0–18.0)
MCH: 30.9 pg (ref 26.0–34.0)
MCHC: 33.6 g/dL (ref 32.0–36.0)
MCV: 91.9 fL (ref 80.0–100.0)
PLATELETS: 220 10*3/uL (ref 150–440)
RBC: 3.78 MIL/uL — AB (ref 4.40–5.90)
RDW: 14.9 % — AB (ref 11.5–14.5)
WBC: 13.2 10*3/uL — AB (ref 3.8–10.6)

## 2015-06-11 LAB — GLUCOSE, CAPILLARY
GLUCOSE-CAPILLARY: 187 mg/dL — AB (ref 65–99)
GLUCOSE-CAPILLARY: 291 mg/dL — AB (ref 65–99)
Glucose-Capillary: 265 mg/dL — ABNORMAL HIGH (ref 65–99)

## 2015-06-11 MED ORDER — LABETALOL HCL 5 MG/ML IV SOLN
10.0000 mg | INTRAVENOUS | Status: DC | PRN
  Administered 2015-06-11: 10 mg via INTRAVENOUS
  Filled 2015-06-11 (×3): qty 4

## 2015-06-11 MED ORDER — INSULIN ASPART 100 UNIT/ML ~~LOC~~ SOLN
0.0000 [IU] | Freq: Four times a day (QID) | SUBCUTANEOUS | Status: DC
  Administered 2015-06-11 (×2): 5 [IU] via SUBCUTANEOUS
  Administered 2015-06-11: 2 [IU] via SUBCUTANEOUS
  Filled 2015-06-11: qty 5
  Filled 2015-06-11: qty 2
  Filled 2015-06-11: qty 5

## 2015-06-11 MED ORDER — ATORVASTATIN CALCIUM 20 MG PO TABS
20.0000 mg | ORAL_TABLET | Freq: Every day | ORAL | Status: DC
  Administered 2015-06-11: 20 mg via ORAL
  Filled 2015-06-11: qty 1

## 2015-06-11 MED ORDER — ONDANSETRON HCL 4 MG/2ML IJ SOLN
4.0000 mg | Freq: Four times a day (QID) | INTRAMUSCULAR | Status: DC | PRN
  Administered 2015-06-11: 4 mg via INTRAVENOUS
  Filled 2015-06-11: qty 2

## 2015-06-11 MED ORDER — ONDANSETRON HCL 4 MG PO TABS
4.0000 mg | ORAL_TABLET | Freq: Four times a day (QID) | ORAL | Status: DC | PRN
Start: 2015-06-11 — End: 2015-06-11

## 2015-06-11 MED ORDER — ACETAMINOPHEN 650 MG RE SUPP: 650.0000 mg | Freq: Four times a day (QID) | RECTAL | Status: DC | PRN

## 2015-06-11 MED ORDER — SIMVASTATIN 40 MG PO TABS: 40.0000 mg | ORAL_TABLET | Freq: Every day | ORAL | Status: DC

## 2015-06-11 MED ORDER — SODIUM CHLORIDE 0.9 % IV SOLN
INTRAVENOUS | Status: DC
  Administered 2015-06-11: 01:00:00 via INTRAVENOUS

## 2015-06-11 MED ORDER — HYDROMORPHONE HCL 1 MG/ML IJ SOLN: 0.5000 mg | INTRAMUSCULAR | Status: DC | PRN

## 2015-06-11 MED ORDER — ACETAMINOPHEN 325 MG PO TABS: 650.0000 mg | ORAL_TABLET | Freq: Four times a day (QID) | ORAL | Status: DC | PRN

## 2015-06-11 MED ORDER — AMLODIPINE BESYLATE 5 MG PO TABS
5.0000 mg | ORAL_TABLET | Freq: Every day | ORAL | Status: DC
  Administered 2015-06-11: 5 mg via ORAL
  Filled 2015-06-11: qty 1

## 2015-06-11 MED ORDER — ONDANSETRON HCL 4 MG PO TABS: 4.0000 mg | ORAL_TABLET | Freq: Four times a day (QID) | ORAL | Status: DC | PRN

## 2015-06-11 MED ORDER — LOSARTAN POTASSIUM 50 MG PO TABS
50.0000 mg | ORAL_TABLET | Freq: Every day | ORAL | Status: DC
  Administered 2015-06-11: 50 mg via ORAL
  Filled 2015-06-11: qty 1

## 2015-06-11 MED ORDER — SENNOSIDES-DOCUSATE SODIUM 8.6-50 MG PO TABS: 1.0000 | ORAL_TABLET | Freq: Every evening | ORAL | Status: DC | PRN

## 2015-06-11 MED ORDER — WARFARIN SODIUM 4 MG PO TABS: 4.0000 mg | ORAL_TABLET | ORAL | Status: DC

## 2015-06-11 MED ORDER — WARFARIN SODIUM 5 MG PO TABS: 5.0000 mg | ORAL_TABLET | ORAL | Status: DC

## 2015-06-11 MED ORDER — PROMETHAZINE HCL 25 MG/ML IJ SOLN: 12.5000 mg | Freq: Four times a day (QID) | INTRAMUSCULAR | Status: DC | PRN

## 2015-06-11 NOTE — Discharge Summary (Signed)
[name] NAME: Billy Henderson    MR#:  [id number]  DATE OF BIRTH:  August 10, 1946  DATE OF ADMISSION:  06/10/2015 ADMITTING PHYSICIAN: Lance Coon, MD  DATE OF DISCHARGE: 06/11/2015  PRIMARY CARE PHYSICIAN: [facility]   ADMISSION DIAGNOSIS:  Right lower quadrant abdominal pain [R10.31] Refractory nausea and vomiting [R11.2]  DISCHARGE DIAGNOSIS:  Principal Problem:   Intractable nausea and vomiting Active Problems:   CKD (chronic kidney disease), stage III   Leukocytosis   Hypertension   Type 2 diabetes mellitus   SECONDARY DIAGNOSIS:   Past Medical History  Diagnosis Date  . Diabetes mellitus without complication   . Hypertension   . Low iron   . Gout   . Kidney function abnormal     HOSPITAL COURSE:   1. Intractable nausea vomiting. Patient feeling better today. Has not vomited today. He tolerated liquid diet this morning. Will advance diet to solid food. If he tolerates this he can be discharged home today. 2. Leukocytosis likely secondary to vomiting 3. Periportal adenopathy seen on CT scan of the abdomen. Patient has no other lymph nodes that are palpable at this point. Recommend following up with PMD as outpatient and potentially oncology. Patient doesn't recall if he's had a recent PSA. He's never had a colonoscopy. He does not have any weight loss or night sweats. Can consider a PET CT scan as outpatient. 4. Thoracic aneurysm seen on CT scan follow-up as outpatient 5. Chronic kidney disease stage III 6. Type 2 diabetes without complication 6. Patient is also on Coumadin.  DISCHARGE CONDITIONS:   Satisfactory  CONSULTS OBTAINED:  None  DRUG ALLERGIES:  No Known Allergies  DISCHARGE MEDICATIONS:   Current Discharge Medication List    START taking these medications   Details  ondansetron (ZOFRAN) 4 MG tablet Take 1 tablet (4 mg total) by mouth every 6 (six) hours as needed for  nausea. Qty: 20 tablet, Refills: 0      CONTINUE these medications which have NOT CHANGED   Details  !! warfarin (COUMADIN) 5 MG tablet Take 5 mg by mouth daily. Tuesday, Thursday, saturday    allopurinol (ZYLOPRIM) 100 MG tablet Take 100 mg by mouth daily.    amLODipine (NORVASC) 2.5 MG tablet Take 5 mg by mouth daily.     furosemide (LASIX) 40 MG tablet Take 40 mg by mouth.    losartan (COZAAR) 50 MG tablet Take 50 mg by mouth daily.    simvastatin (ZOCOR) 40 MG tablet Take 40 mg by mouth daily.    !! warfarin (COUMADIN) 4 MG tablet Take 4 mg by mouth daily. Monday, Wednesdays, Fridays, and Sundays     !! - Potential duplicate medications found. Please discuss with provider.       DISCHARGE INSTRUCTIONS:   Follow-up once 2 weeks with medical back.  If you experience worsening of your admission symptoms, develop shortness of breath, life threatening emergency, suicidal or homicidal thoughts you must seek medical attention immediately by calling 911 or calling your MD immediately  if symptoms less severe.  You Must read complete instructions/literature along with all the possible adverse reactions/side effects for all the Medicines you take and that have been prescribed to you. Take any new Medicines after you have completely understood and accept all the possible adverse reactions/side effects.   Please note  You were cared for by a hospitalist during your hospital stay. If you have any questions about your discharge  medications or the care you received while you were in the hospital after you are discharged, you can call the unit and asked to speak with the hospitalist on call if the hospitalist that took care of you is not available. Once you are discharged, your primary care physician will handle any further medical issues. Please note that NO REFILLS for any discharge medications will be authorized once you are discharged, as it is imperative that you return to your primary  care physician (or establish a relationship with a primary care physician if you do not have one) for your aftercare needs so that they can reassess your need for medications and monitor your lab values.    Today   CHIEF COMPLAINT:   Chief Complaint  Patient presents with  . Flank Pain    HISTORY OF PRESENT ILLNESS:  Billy Henderson  is a 69 y.o. male with a known history of presented with flank pain and nausea vomiting   VITAL SIGNS:  Blood pressure 173/83, pulse 90, temperature 98.7 F (37.1 C), temperature source Oral, resp. rate 17, height 5\' 6"  (1.676 m), weight 75.5 kg (166 lb 7.2 oz), SpO2 97 %.    PHYSICAL EXAMINATION:  GENERAL:  69 y.o.-year-old patient lying in the bed with no acute distress.  EYES: Pupils equal, round, reactive to light and accommodation. No scleral icterus. Extraocular muscles intact.  HEENT: Head atraumatic, normocephalic. Oropharynx and nasopharynx clear.  NECK:  Supple, no jugular venous distention. No thyroid enlargement, no tenderness.  LUNGS: Normal breath sounds bilaterally, no wheezing, rales,rhonchi or crepitation. No use of accessory muscles of respiration.  CARDIOVASCULAR: S1, S2 normal. No murmurs, rubs, or gallops.  ABDOMEN: Soft, non-tender, non-distended. Bowel sounds present. No organomegaly or mass.  EXTREMITIES: No pedal edema, cyanosis, or clubbing.  NEUROLOGIC: Cranial nerves II through XII are intact. Muscle strength 5/5 in all extremities. Sensation intact. Gait not checked.  PSYCHIATRIC: The patient is alert and oriented x 3.  SKIN: No obvious rash, lesion, or ulcer.   DATA REVIEW:   CBC  Recent Labs Lab 06/11/15 0636  WBC 13.2*  HGB 11.7*  HCT 34.8*  PLT 220    Chemistries   Recent Labs Lab 06/10/15 1824 06/11/15 0636  NA 133* 139  K 4.9 4.2  CL 100* 107  CO2 25 24  GLUCOSE 380* 182*  BUN 47* 39*  CREATININE 1.98* 1.78*  CALCIUM 9.8 9.5  AST 25  --   ALT 16*  --   ALKPHOS 138*  --   BILITOT 1.1  --      [facility]  Ct Abdomen Pelvis Wo Contrast  06/10/2015   CLINICAL DATA:  Sudden onset right flank pain with nausea and vomiting beginning this morning. No history of kidney stones.  EXAM: CT ABDOMEN AND PELVIS WITHOUT CONTRAST  TECHNIQUE: Multidetector CT imaging of the abdomen and pelvis was performed following the standard protocol without IV contrast.  COMPARISON:  None.  FINDINGS: Lung bases are within normal. There is calcification of the mitral valve annulus.  Abdominal images demonstrate a normal liver, spleen, gallbladder, pancreas and adrenal glands. Kidneys are normal size without hydronephrosis or nephrolithiasis. Subcentimeter left renal cortical hypodensity too small to characterize but likely a cyst. Ureters are normal. Appendix is normal.  There is moderate calcified plaque over the abdominal aorta and iliac arteries.  There is a prominent 2.3 cm periaortic lymph node when measured by short axis. Few smaller periaortic and mesenteric lymph nodes present. There is no  free fluid or focal inflammatory change.  Mild fecal retention throughout the colon. The colon and small bowel are otherwise within normal.  Pelvic images demonstrate mild prominence of the prostate with impression upon the bladder base. Bladder is within normal. Rectum is normal. No free fluid or adenopathy in the pelvis.  Mild degenerative change of the spine and hips. Multi level disc disease throughout the lumbar spine. Several well-defined hypodense lesions within the vertebral bodies likely hemangiomas. 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  IMPRESSION: No acute findings in the abdomen/pelvis.  Mild periaortic adenopathy with lymph node measuring 2.3 cm by short axis. This is concerning for metastatic disease as recommend clinical correlation and further imaging as clinically indicated as chest CT may be helpful prior to consideration of PET-CT.  Subcentimeter left renal cortical hypodensity too  small to characterize but likely a cyst.   Electronically Signed   By: Marin Olp M.D.   On: 06/10/2015 21:39   Ct Chest Wo Contrast  06/11/2015   CLINICAL DATA:  Abnormality seen on abdominal and pelvic CT. No history of previous cancer. History of aortic valve replacement.  EXAM: CT CHEST WITHOUT CONTRAST  TECHNIQUE: Multidetector CT imaging of the chest was performed following the standard protocol without IV contrast.  COMPARISON:  Abdominal and pelvic CT earlier tonight  FINDINGS: Heart: Significant calcification involving the mitral annulus. Aortic valve replacement. Coronary artery calcifications are extensive. Heart size is normal. No pericardial effusion.  Vascular structures: Extensive atherosclerotic calcification of the thoracic arch and great vessels. Incidental note is made of bovine aortic arch anatomy. The ascending aorta is aneurysmal, 4.5 cm in diameter. Descending aorta is not aneurysmal.  Mediastinum/thyroid: The visualized portion of the thyroid gland has a normal appearance. No mediastinal, hilar, or axillary adenopathy.  Lungs/Airways: No pulmonary nodules, pleural effusions, or infiltrates.  Upper abdomen: Atherosclerotic calcification of the abdominal aorta. Partially imaged periaortic lymph node as seen on exam earlier.  Chest wall/osseous structures: Degenerative changes are identified at numerous levels in the thoracic spine. No suspicious lytic or blastic lesions are identified.  IMPRESSION: 1. Ascending thoracic aortic aneurysm. Recommend semi-annual imaging followup by CTA or MRA and referral to cardiothoracic surgery if not already obtained. This recommendation follows 2010 ACCF/AHA/AATS/ACR/ASA/SCA/SCAI/SIR/STS/SVM Guidelines for the Diagnosis and Management of Patients With Thoracic Aortic Disease. Circulation. 2010; 121: [id number] 2. Extensive coronary artery calcification. Aortic valve replacement. 3. No evidence for malignancy in the chest. 4. Partially imaged periaortic  lymph node as seen on CT of the abdomen and pelvis earlier today.   Electronically Signed   By: Nolon Nations M.D.   On: 06/11/2015 00:19   Management plans discussed with the patient, family and they are in agreement.  CODE STATUS:     Code Status Orders        Start     Ordered   06/11/15 0034  Full code   Continuous     06/11/15 0033    Advance Directive Documentation        Most Recent Value   Type of Advance Directive  Living will   Pre-existing out of facility DNR order (yellow form or pink MOST form)     "MOST" Form in Place?        TOTAL TIME TAKING CARE OF THIS PATIENT: 35 minutes.    Loletha Grayer M.D on 06/11/2015 at 11:01 AM  Between 7am to 6pm - Pager - [phone]  After 6pm go to [email or web address] - password [email or web address] [facility]  [facility]ospital[facility]  Office  [phone]  CC: Primary care physician; [facility]

## 2015-06-11 NOTE — Progress Notes (Signed)
Pt d/c home; d/c instructions reviewed w/ pt; pt understanding was verbalized; IV removed catheter in tact, gauze dressing applied; all pt questions answered; pt left unit via wheelchair accompanied by staff 

## 2015-06-11 NOTE — Discharge Instructions (Addendum)
Nausea and Vomiting °Nausea is a sick feeling that often comes before throwing up (vomiting). Vomiting is a reflex where stomach contents come out of your mouth. Vomiting can cause severe loss of body fluids (dehydration). Children and elderly adults can become dehydrated quickly, especially if they also have diarrhea. Nausea and vomiting are symptoms of a condition or disease. It is important to find the cause of your symptoms. °CAUSES  °· Direct irritation of the stomach lining. This irritation can result from increased acid production (gastroesophageal reflux disease), infection, food poisoning, taking certain medicines (such as nonsteroidal anti-inflammatory drugs), alcohol use, or tobacco use. °· Signals from the brain. These signals could be caused by a headache, heat exposure, an inner ear disturbance, increased pressure in the brain from injury, infection, a tumor, or a concussion, pain, emotional stimulus, or metabolic problems. °· An obstruction in the gastrointestinal tract (bowel obstruction). °· Illnesses such as diabetes, hepatitis, gallbladder problems, appendicitis, kidney problems, cancer, sepsis, atypical symptoms of a heart attack, or eating disorders. °· Medical treatments such as chemotherapy and radiation. °· Receiving medicine that makes you sleep (general anesthetic) during surgery. °DIAGNOSIS °Your caregiver may ask for tests to be done if the problems do not improve after a few days. Tests may also be done if symptoms are severe or if the reason for the nausea and vomiting is not clear. Tests may include: °· Urine tests. °· Blood tests. °· Stool tests. °· Cultures (to look for evidence of infection). °· X-rays or other imaging studies. °Test results can help your caregiver make decisions about treatment or the need for additional tests. °TREATMENT °You need to stay well hydrated. Drink frequently but in small amounts. You may wish to drink water, sports drinks, clear broth, or eat frozen  ice pops or gelatin dessert to help stay hydrated. When you eat, eating slowly may help prevent nausea. There are also some antinausea medicines that may help prevent nausea. °HOME CARE INSTRUCTIONS  °· Take all medicine as directed by your caregiver. °· If you do not have an appetite, do not force yourself to eat. However, you must continue to drink fluids. °· If you have an appetite, eat a normal diet unless your caregiver tells you differently. °¨ Eat a variety of complex carbohydrates (rice, wheat, potatoes, bread), lean meats, yogurt, fruits, and vegetables. °¨ Avoid high-fat foods because they are more difficult to digest. °· Drink enough water and fluids to keep your urine clear or pale yellow. °· If you are dehydrated, ask your caregiver for specific rehydration instructions. Signs of dehydration may include: °¨ Severe thirst. °¨ Dry lips and mouth. °¨ Dizziness. °¨ Dark urine. °¨ Decreasing urine frequency and amount. °¨ Confusion. °¨ Rapid breathing or pulse. °SEEK IMMEDIATE MEDICAL CARE IF:  °· You have blood or brown flecks (like coffee grounds) in your vomit. °· You have black or bloody stools. °· You have a severe headache or stiff neck. °· You are confused. °· You have severe abdominal pain. °· You have chest pain or trouble breathing. °· You do not urinate at least once every 8 hours. °· You develop cold or clammy skin. °· You continue to vomit for longer than 24 to 48 hours. °· You have a fever. °MAKE SURE YOU:  °· Understand these instructions. °· Will watch your condition. °· Will get help right away if you are not doing well or get worse. °Document Released: 09/07/2005 Document Revised: 11/30/2011 Document Reviewed: 02/04/2011 °ExitCare® Patient Information ©2015 [employer]. This information is not intended   to replace advice given to you by your health care provider. Make sure you discuss any questions you have with your health care provider.   Follow all MD discharge instructions. Take all  medications as prescribed. Keep all follow up appointments. If your symptoms return, call your doctor. If you experience any new symptoms that are of concern to you or that are bothersome to you, call your doctor. For all questions and/or concerns, call your doctor.   If you have a medical emergency, call 911

## 2016-07-21 ENCOUNTER — Encounter: Payer: Self-pay | Admitting: Emergency Medicine

## 2016-07-21 ENCOUNTER — Emergency Department
Admission: EM | Admit: 2016-07-21 | Discharge: 2016-07-21 | Disposition: A | Discharge status: Home / Self Care | Payer: Medicare Other | Attending: Emergency Medicine | Admitting: Emergency Medicine

## 2016-07-21 DIAGNOSIS — E860 Dehydration: Secondary | ICD-10-CM

## 2016-07-21 DIAGNOSIS — I129 Hypertensive chronic kidney disease with stage 1 through stage 4 chronic kidney disease, or unspecified chronic kidney disease: Secondary | ICD-10-CM | POA: Insufficient documentation

## 2016-07-21 DIAGNOSIS — E1122 Type 2 diabetes mellitus with diabetic chronic kidney disease: Secondary | ICD-10-CM | POA: Insufficient documentation

## 2016-07-21 DIAGNOSIS — N183 Chronic kidney disease, stage 3 (moderate): Secondary | ICD-10-CM | POA: Insufficient documentation

## 2016-07-21 DIAGNOSIS — Z7901 Long term (current) use of anticoagulants: Secondary | ICD-10-CM | POA: Insufficient documentation

## 2016-07-21 DIAGNOSIS — Z79899 Other long term (current) drug therapy: Secondary | ICD-10-CM | POA: Insufficient documentation

## 2016-07-21 DIAGNOSIS — N179 Acute kidney failure, unspecified: Secondary | ICD-10-CM

## 2016-07-21 LAB — BASIC METABOLIC PANEL
Anion gap: 9 (ref 5–15)
BUN: 53 mg/dL — ABNORMAL HIGH (ref 6–20)
CALCIUM: 9.6 mg/dL (ref 8.9–10.3)
CO2: 25 mmol/L (ref 22–32)
CREATININE: 3.77 mg/dL — AB (ref 0.61–1.24)
Chloride: 102 mmol/L (ref 101–111)
GFR calc Af Amer: 17 mL/min — ABNORMAL LOW (ref >60)
GFR, EST NON AFRICAN AMERICAN: 15 mL/min — AB (ref >60)
GLUCOSE: 121 mg/dL — AB (ref 65–99)
Potassium: 3.8 mmol/L (ref 3.5–5.1)
Sodium: 136 mmol/L (ref 135–145)

## 2016-07-21 LAB — CBC
HCT: 37.7 % — ABNORMAL LOW (ref 40.0–52.0)
Hemoglobin: 12.4 g/dL — ABNORMAL LOW (ref 13.0–18.0)
MCH: 30.8 pg (ref 26.0–34.0)
MCHC: 32.8 g/dL (ref 32.0–36.0)
MCV: 93.8 fL (ref 80.0–100.0)
PLATELETS: 228 10*3/uL (ref 150–440)
RBC: 4.01 MIL/uL — ABNORMAL LOW (ref 4.40–5.90)
RDW: 15.3 % — AB (ref 11.5–14.5)
WBC: 8.3 10*3/uL (ref 3.8–10.6)

## 2016-07-21 LAB — TROPONIN I

## 2016-07-21 LAB — GLUCOSE, CAPILLARY: GLUCOSE-CAPILLARY: 122 mg/dL — AB (ref 65–99)

## 2016-07-21 LAB — PROTIME-INR
INR: 1.96
Prothrombin Time: 22.6 seconds — ABNORMAL HIGH (ref 11.4–15.2)

## 2016-07-21 MED ORDER — SODIUM CHLORIDE 0.9 % IV BOLUS (SEPSIS)
1000.0000 mL | Freq: Once | INTRAVENOUS | Status: AC
  Administered 2016-07-21: 1000 mL via INTRAVENOUS

## 2016-07-21 NOTE — ED Triage Notes (Signed)
Pt come into the ED via POV c/o generalized weakness.  States he has been getting tired more than normal since yesterday.  No limb weakness associates with it and denies any aphasia.  Patient has a known decoding aortic aneurism.  Patient present in NAD at this time and ambulated well into the triage room.  Patient has even and unlabored respirations and denies any chest pain or shortness of breath.

## 2016-07-21 NOTE — ED Provider Notes (Signed)
[facility] Emergency Department Provider Note  ____________________________________________   I have reviewed the triage vital signs and the nursing notes.   HISTORY  Chief Complaint Weakness and Hypotension   History limited by: Not Limited   HPI Billy Henderson is a 69 y.o. male who presents to the emergency department tonight because of concern for weakness and low blood pressure. The patient states that his symptoms of weakness started yesterday. He was noticing that he would have to stop while he was working in the yard. Today he states that he continued to have weakness while working in the yard. Checked his blood pressure at home and it was low. He does not think that he was drinking enough. He denies any chest pain or palpitations.     Past Medical History:  Diagnosis Date  . Diabetes mellitus without complication ([place])   . Gout   . Hypertension   . Kidney function abnormal   . Low iron     Patient Active Problem List   Diagnosis Date Noted  . Intractable nausea and vomiting 06/10/2015  . CKD (chronic kidney disease), stage III 06/10/2015  . Leukocytosis 06/10/2015  . Hypertension 06/10/2015  . Type 2 diabetes mellitus ([place]) 06/10/2015    Past Surgical History:  Procedure Laterality Date  . CARDIAC VALVE REPLACEMENT    . EYE SURGERY Right    Diabetic retinopathy     Prior to Admission medications   Medication Sig Start Date End Date Taking? Authorizing Provider  allopurinol (ZYLOPRIM) 100 MG tablet Take 100 mg by mouth daily.    Historical Provider, MD  amLODipine (NORVASC) 2.5 MG tablet Take 5 mg by mouth daily.     Historical Provider, MD  furosemide (LASIX) 40 MG tablet Take 40 mg by mouth.    Historical Provider, MD  losartan (COZAAR) 50 MG tablet Take 50 mg by mouth daily.    Historical Provider, MD  ondansetron (ZOFRAN) 4 MG tablet Take 1 tablet (4 mg total) by mouth every 6 (six) hours as needed for nausea. 06/11/15   Loletha Grayer, MD  simvastatin (ZOCOR) 40 MG tablet Take 40 mg by mouth daily.    Historical Provider, MD  warfarin (COUMADIN) 4 MG tablet Take 4 mg by mouth daily. Monday, Wednesdays, Fridays, and Sundays    Historical Provider, MD  warfarin (COUMADIN) 5 MG tablet Take 5 mg by mouth daily. Tuesday, Thursday, saturday    Historical Provider, MD    Allergies Review of patient's allergies indicates no known allergies.  Family History  Problem Relation Age of Onset  . Diabetes      Social History Social History  Substance Use Topics  . Smoking status: Never Smoker  . Smokeless tobacco: Never Used  . Alcohol use No    Review of Systems  Constitutional: Negative for fever. Cardiovascular: Negative for chest pain. Respiratory: Negative for shortness of breath. Gastrointestinal: Negative for abdominal pain, vomiting and diarrhea. Genitourinary: Negative for dysuria. Musculoskeletal: Negative for back pain. Skin: Negative for rash. Neurological: Negative for headaches, focal weakness or numbness.  10-point ROS otherwise negative.  ____________________________________________   PHYSICAL EXAM:  VITAL SIGNS: ED Triage Vitals [07/21/16 1728]  Enc Vitals Group     BP (!) 97/48     Pulse Rate 83     Resp 18     Temp 98.2 F (36.8 C)     Temp Source Oral     SpO2 100 %     Weight 165 lb (74.8  kg)     Height 5\' 6"  (1.676 m)   Constitutional: Alert and oriented. Well appearing and in no distress. Eyes: Conjunctivae are normal. Normal extraocular movements. ENT   Head: Normocephalic and atraumatic.   Nose: No congestion/rhinnorhea.   Mouth/Throat: Mucous membranes are moist.   Neck: No stridor. Hematological/Lymphatic/Immunilogical: No cervical lymphadenopathy. Cardiovascular: Normal rate, regular rhythm. Respiratory: Normal respiratory effort without tachypnea nor retractions. Breath sounds are clear and equal bilaterally. No  wheezes/rales/rhonchi. Gastrointestinal: Soft and nontender. No distention.  Genitourinary: Deferred Musculoskeletal: Normal range of motion in all extremities. No lower extremity edema. Neurologic:  Normal speech and language. No gross focal neurologic deficits are appreciated.  Skin:  Skin is warm, dry and intact. No rash noted. Psychiatric: Mood and affect are normal. Speech and behavior are normal. Patient exhibits appropriate insight and judgment.  ____________________________________________    LABS (pertinent positives/negatives)  Labs Reviewed  BASIC METABOLIC PANEL - Abnormal; Notable for the following:       Result Value   Glucose, Bld 121 (*)    BUN 53 (*)    Creatinine, Ser 3.77 (*)    GFR calc non Af Amer 15 (*)    GFR calc Af Amer 17 (*)    All other components within normal limits  CBC - Abnormal; Notable for the following:    RBC 4.01 (*)    Hemoglobin 12.4 (*)    HCT 37.7 (*)    RDW 15.3 (*)    All other components within normal limits  GLUCOSE, CAPILLARY - Abnormal; Notable for the following:    Glucose-Capillary 122 (*)    All other components within normal limits  PROTIME-INR - Abnormal; Notable for the following:    Prothrombin Time 22.6 (*)    All other components within normal limits  TROPONIN I  URINALYSIS COMPLETEWITH MICROSCOPIC ([facility] ONLY)  CBG MONITORING, ED     ____________________________________________   EKG  I, Nance Pear, attending physician, personally viewed and interpreted this EKG  EKG Time: 1737 Rate: 85 Rhythm: normal sinus rhythm Axis: left axis deviation Intervals: qtc 535 QRS: LBBB ST changes: no st elevation Impression: abnormal ekg   ____________________________________________    RADIOLOGY  None  ____________________________________________   PROCEDURES  Procedures  ____________________________________________   INITIAL IMPRESSION / ASSESSMENT AND PLAN / ED COURSE  Pertinent labs & imaging  results that were available during my care of the patient were reviewed by me and considered in my medical decision making (see chart for details).  Patient presented because of concern for low blood pressure, weakness. Blood work is concerning for elevated creatinine. I discussed this finding of AKI with the patient. I did recommend admission. The patient however declined admission for IV hydration and chemistry recheck. He was however willing to stay in the emergency department for fluid bolus. I discussed with the patient the importance of following up closely with primary care for chemistry recheck.   ____________________________________________   FINAL CLINICAL IMPRESSION(S) / ED DIAGNOSES  Final diagnoses:  Dehydration  AKI (acute kidney injury) ([place])     Note: This dictation was prepared with Dragon dictation. Any transcriptional errors that result from this process are unintentional    Nance Pear, MD 07/21/16 2027

## 2016-07-21 NOTE — ED Triage Notes (Signed)
Brought in via family with increased weakness and low b/p today

## 2016-07-21 NOTE — Discharge Instructions (Signed)
As we discussed it is very important that you have your creatinine level rechecked (a marker of kidney function). Additionally please be sure to continue to hydrate. Please seek medical attention for any high fevers, chest pain, shortness of breath, change in behavior, persistent vomiting, bloody stool or any other new or concerning symptoms.

## 2016-07-21 NOTE — ED Notes (Addendum)
Pt states feeling weak, tired and dizzy since yesterday, states feeling as if his sugar was low but states at home it was in the 300s, states his wife checked his BP at home and it was in the 80s for SBP, pt has hx of descending aortic aneurysm and states it is being watched at the [place], pt denies any pain, or SOB, at present pt awake and alert, wife at bedside, pt on coumadin with hx of mechanical aortic valve replacement

## 2016-07-21 NOTE — ED Notes (Signed)
EDP at bedside  

## 2016-08-13 IMAGING — CT CT ABD-PELV W/O CM
1 of 2 series · 15 of 32 positions shown, 19 images · non-contrast
Comparison: None.

CLINICAL DATA: Sudden onset right flank pain with nausea and
vomiting beginning this morning. No history of kidney stones.

EXAM:
CT ABDOMEN AND PELVIS WITHOUT CONTRAST
TECHNIQUE: Multidetector CT imaging of the abdomen and pelvis was performed
following the standard protocol without IV contrast.

[Series 2: routine abd pel without · axial · non-contrast · 0.75mm/px · z∈[-484,-80]mm · 15 of 89 slices shown, 19 images]
[im 4/89  soft-tissue]
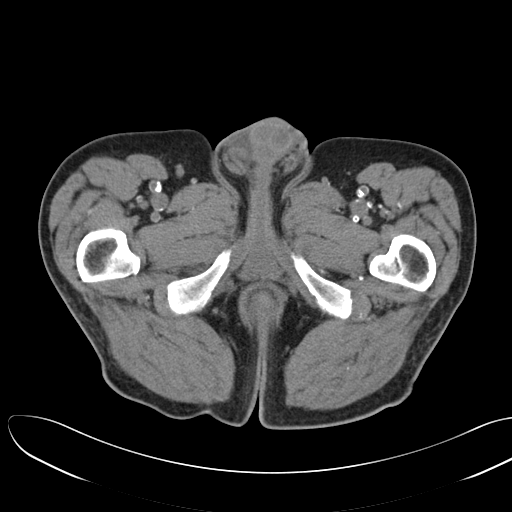
[im 4/89  bone]
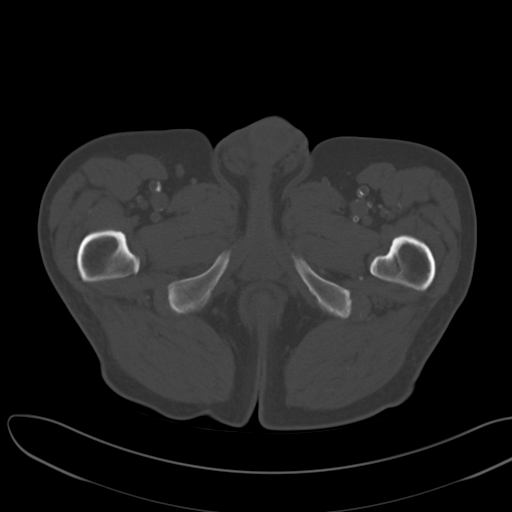
[im 12/89  soft-tissue]
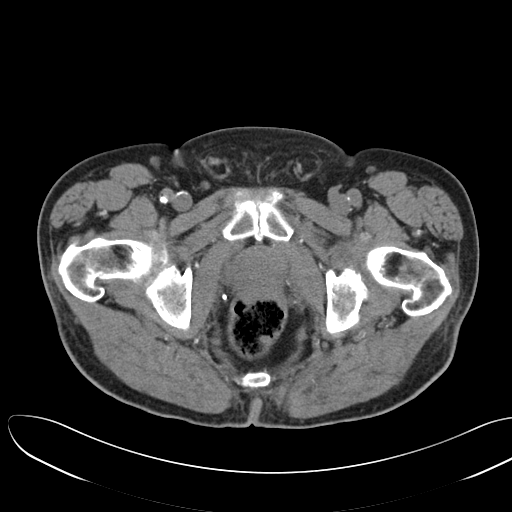
[im 20/89  soft-tissue]
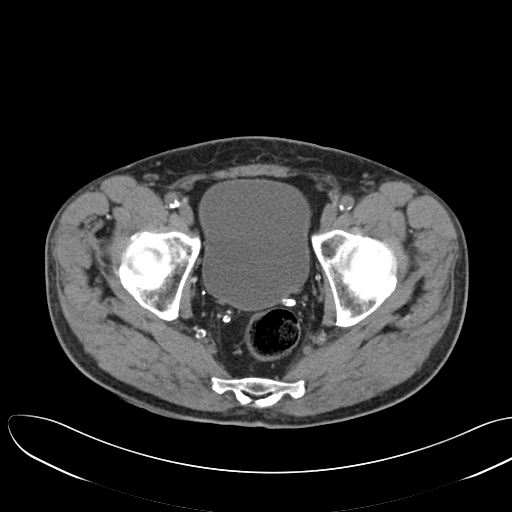
[im 23/89  soft-tissue]
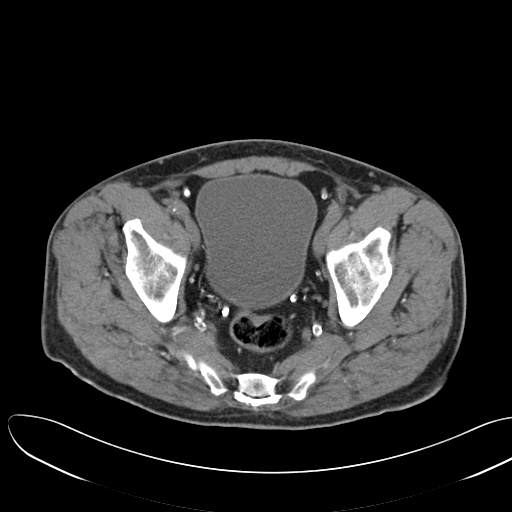
[im 31/89  soft-tissue]
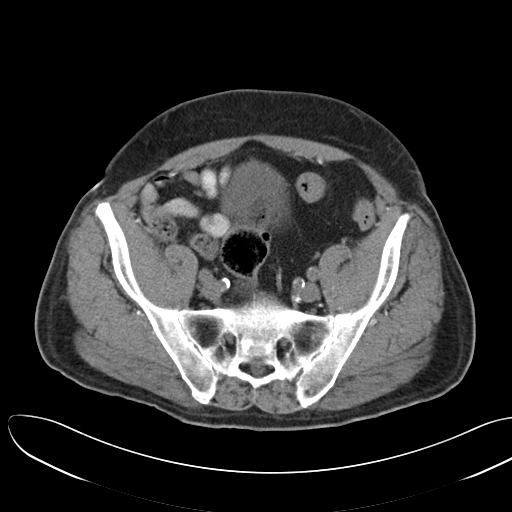
[im 39/89  soft-tissue]
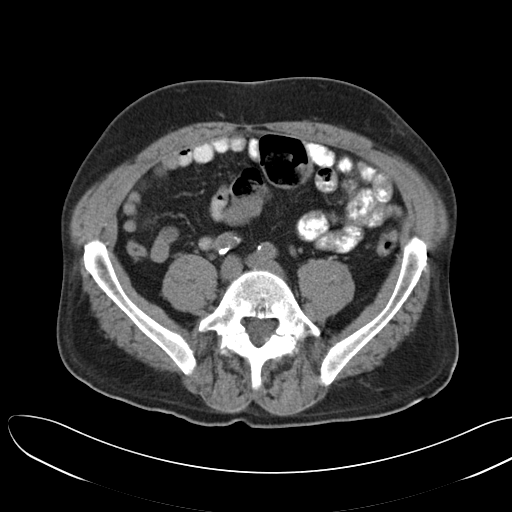
[im 46/89  soft-tissue]
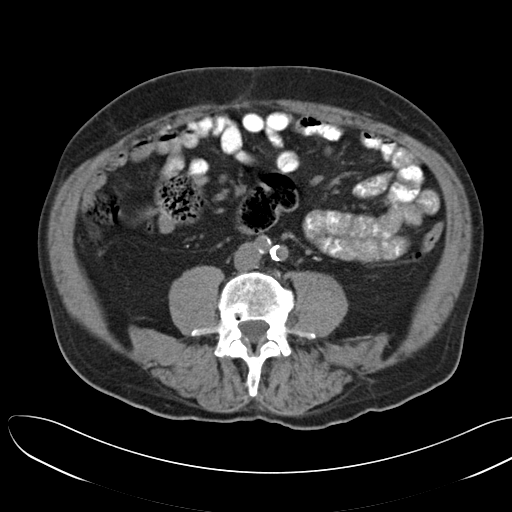
[im 50/89  soft-tissue]
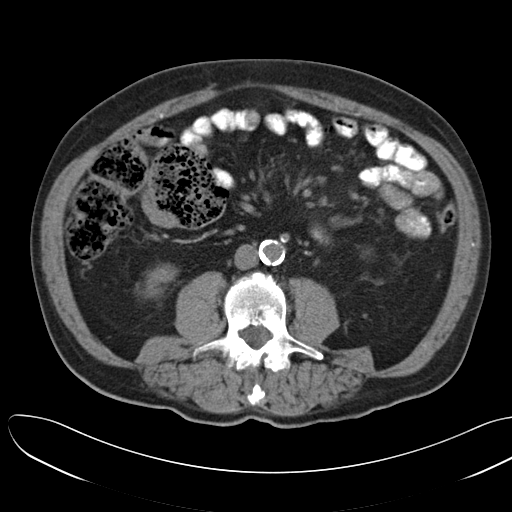
[im 58/89  soft-tissue]
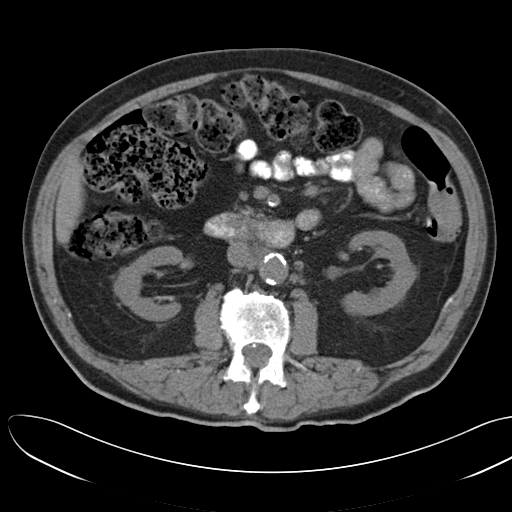
[im 58/89  bone]
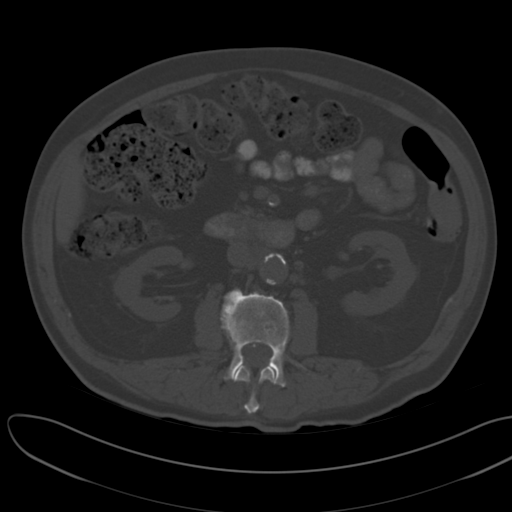
[im 66/89  soft-tissue]
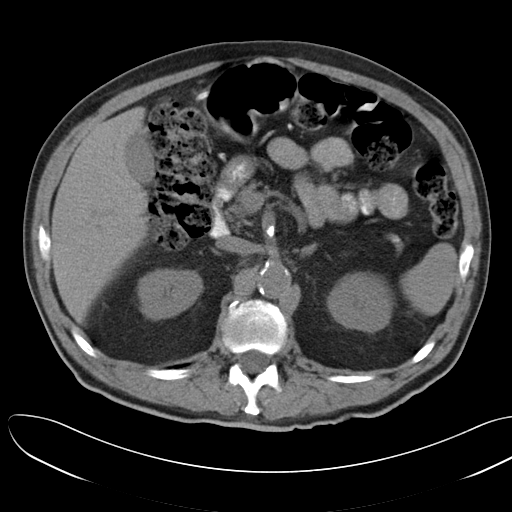
[im 69/89  soft-tissue]
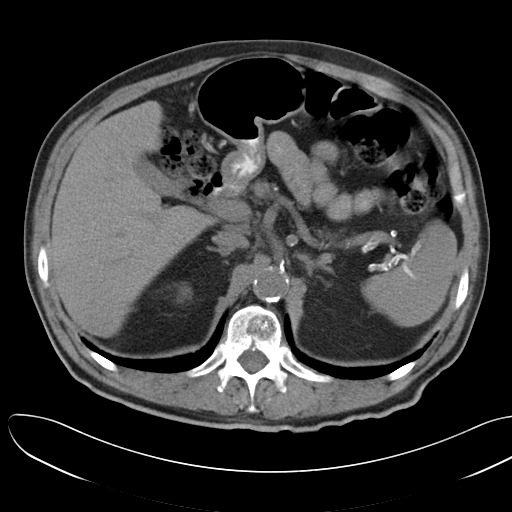
[im 73/89  lung]
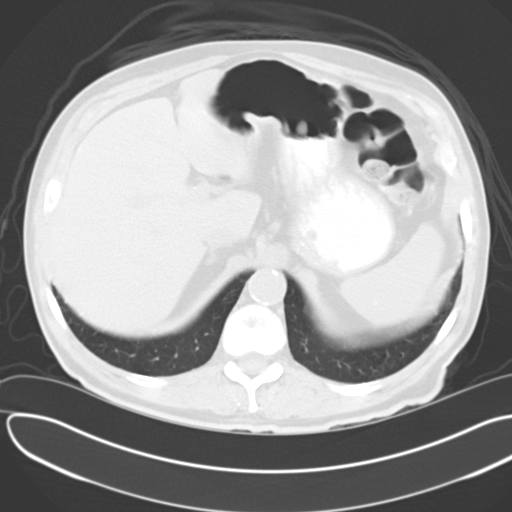
[im 77/89  soft-tissue]
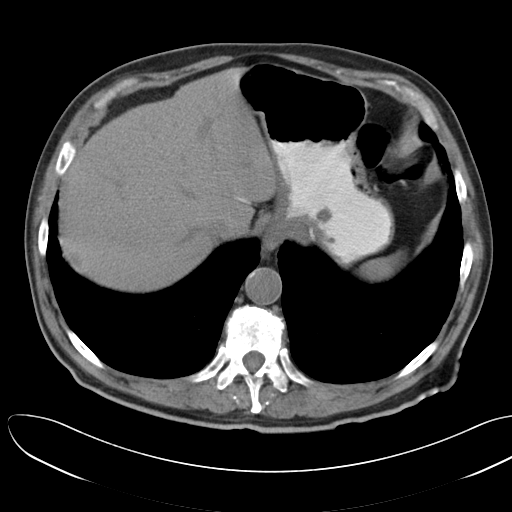
[im 77/89  lung]
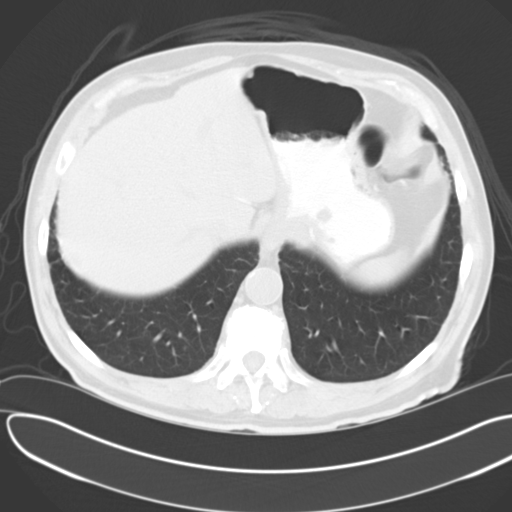
[im 81/89  lung]
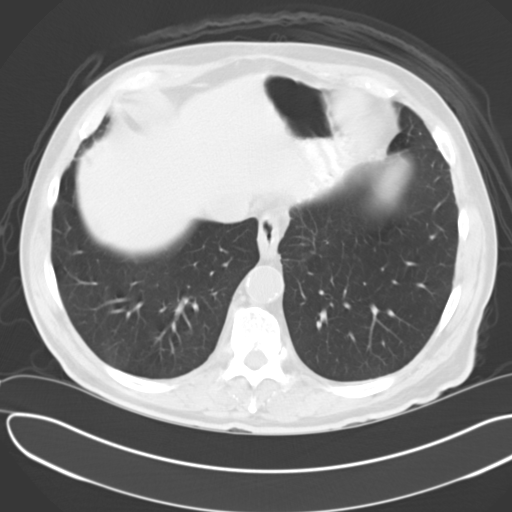
[im 85/89  soft-tissue]
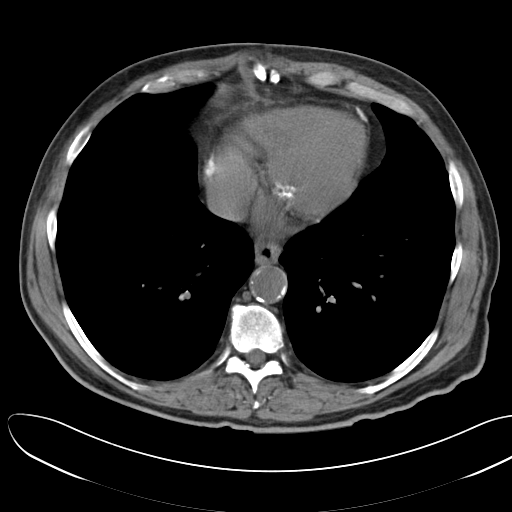
[im 85/89  lung]
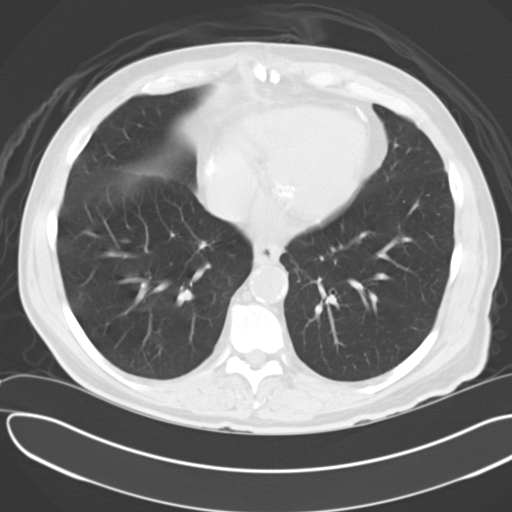

[15 of 32 positions shown; findings below may reference images not displayed]

FINDINGS: Lung bases are within normal. There is calcification of the mitral
valve annulus.

Abdominal images demonstrate a normal liver, spleen, gallbladder,
pancreas and adrenal glands. Kidneys are normal size without
hydronephrosis or nephrolithiasis. Subcentimeter left renal cortical
hypodensity too small to characterize but likely a cyst. Ureters are
normal. Appendix is normal.

There is moderate calcified plaque over the abdominal aorta and
iliac arteries.

There is a prominent 2.3 cm periaortic lymph node when measured by
short axis. Few smaller periaortic and mesenteric lymph nodes
present. There is no free fluid or focal inflammatory change.

Mild fecal retention throughout the colon. The colon and small bowel
are otherwise within normal.

Pelvic images demonstrate mild prominence of the prostate with
impression upon the bladder base. Bladder is within normal. Rectum
is normal. No free fluid or adenopathy in the pelvis.

Mild degenerative change of the spine and hips. Multi level disc
disease throughout the lumbar spine. Several well-defined hypodense
lesions within the vertebral bodies likely hemangiomas. Chronic
bilateral L5 spondylolysis without spondylolisthesis.
IMPRESSION: No acute findings in the abdomen/pelvis.

Mild periaortic adenopathy with lymph node measuring 2.3 cm by short
axis. This is concerning for metastatic disease as recommend
clinical correlation and further imaging as clinically indicated as
chest CT may be helpful prior to consideration of PET-CT.

Subcentimeter left renal cortical hypodensity too small to
characterize but likely a cyst.

## 2016-08-13 IMAGING — CT CT CHEST W/O CM
1 series · 15 of 31 positions shown, 19 images · non-contrast
Comparison: Abdominal and pelvic CT earlier tonight

CLINICAL DATA: Abnormality seen on abdominal and pelvic CT. No
history of previous cancer. History of aortic valve replacement.

EXAM:
CT CHEST WITHOUT CONTRAST
TECHNIQUE: Multidetector CT imaging of the chest was performed following the
standard protocol without IV contrast.

[Series 2: routine chest wo · axial · 0.75mm/px · z∈[-336,-46]mm · 15 of 64 slices shown, 19 images]
[im 3/64  mediastinal]
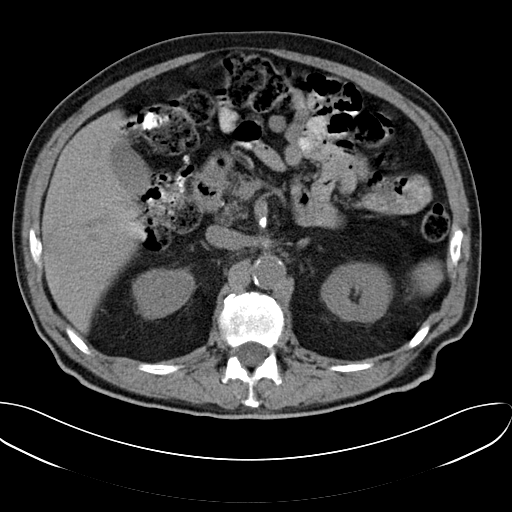
[im 3/64  lung]
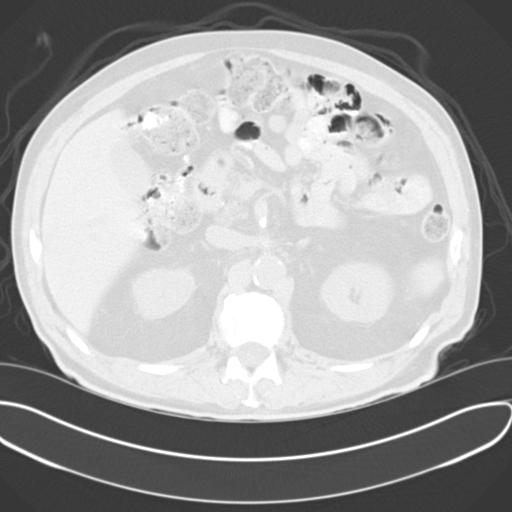
[im 8/64  lung]
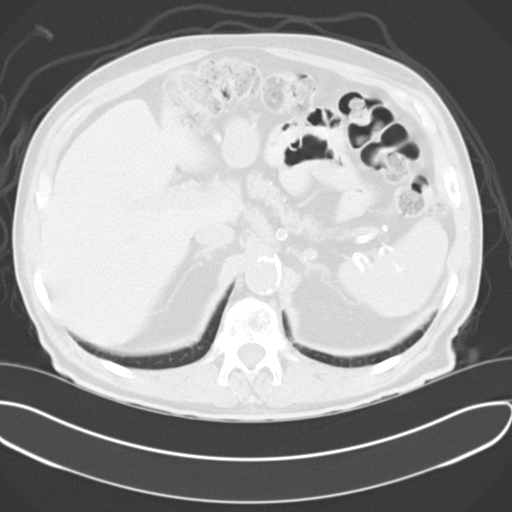
[im 12/64  lung]
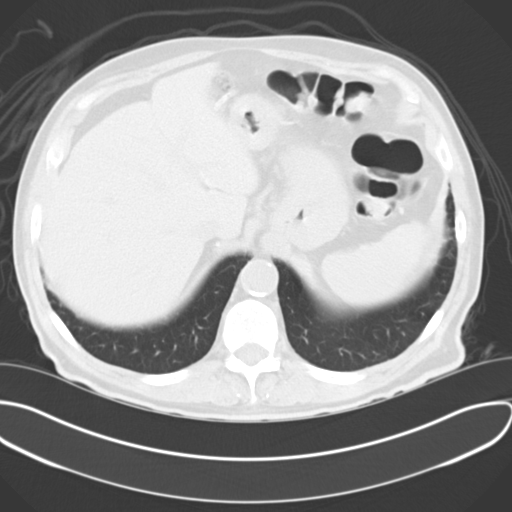
[im 15/64  lung]
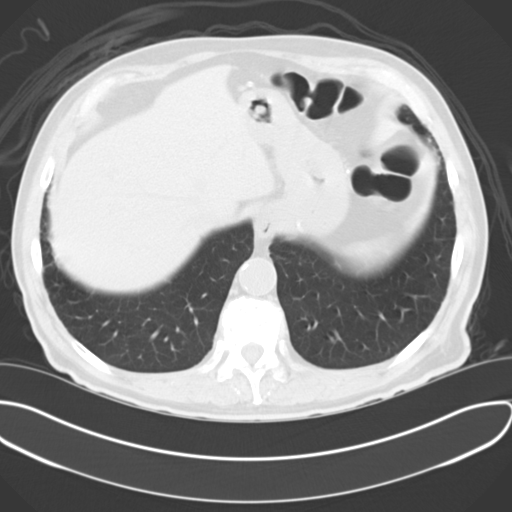
[im 19/64  mediastinal]
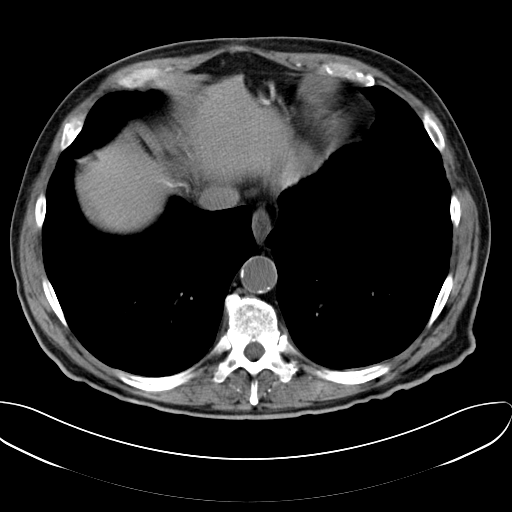
[im 19/64  lung]
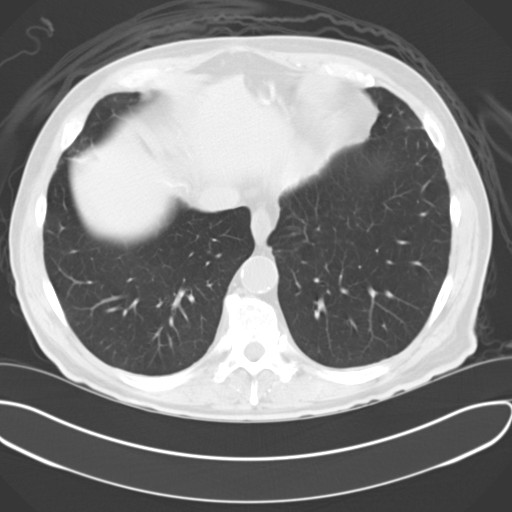
[im 24/64  lung]
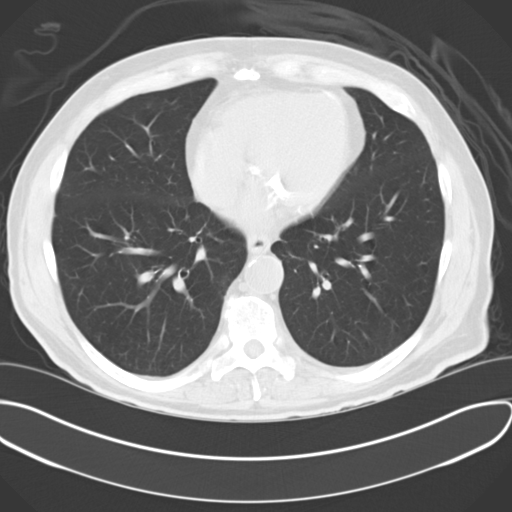
[im 29/64  lung]
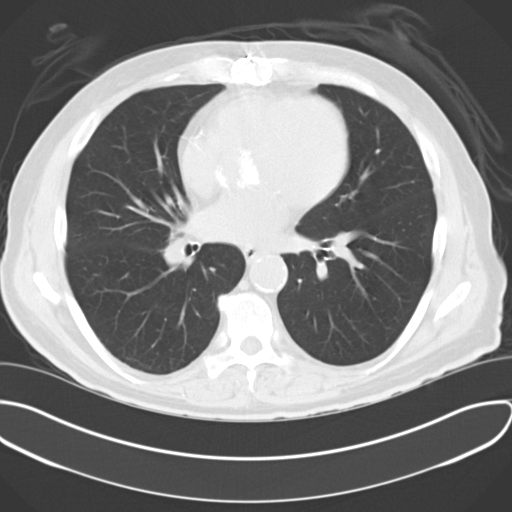
[im 33/64  lung]
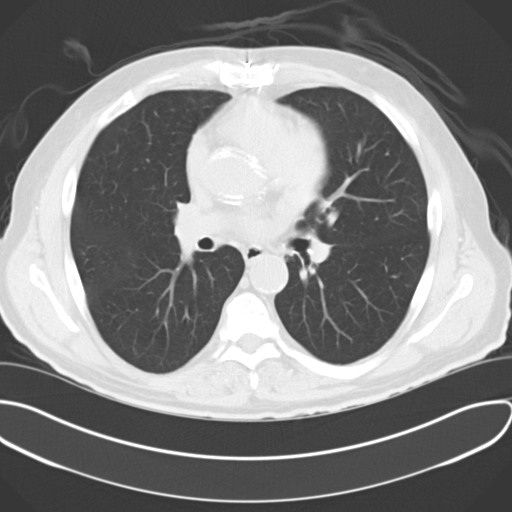
[im 36/64  mediastinal]
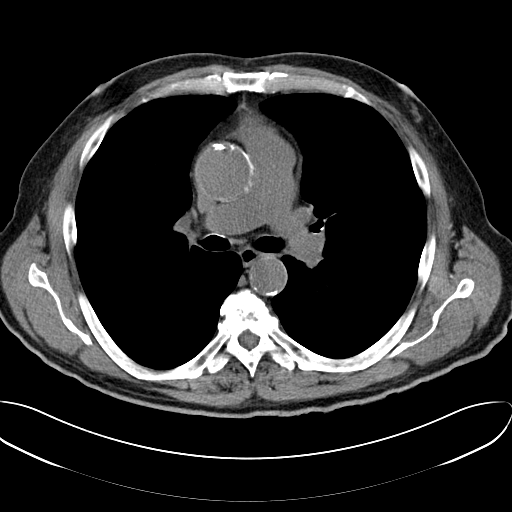
[im 36/64  lung]
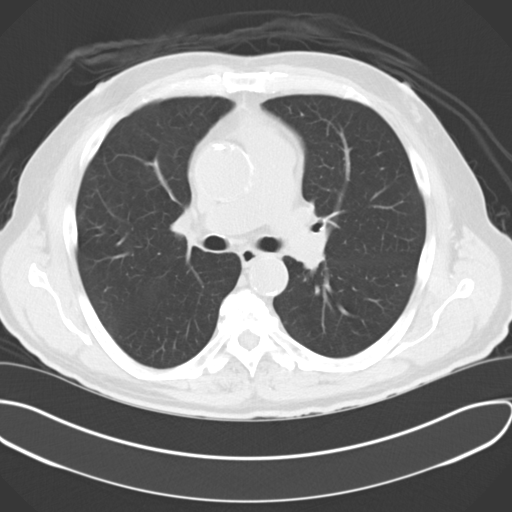
[im 40/64  lung]
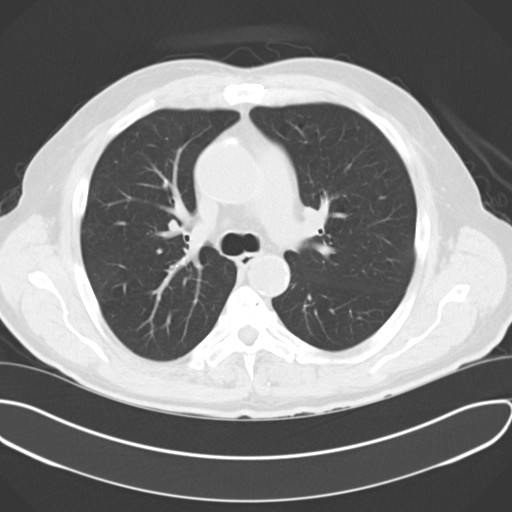
[im 45/64  lung]
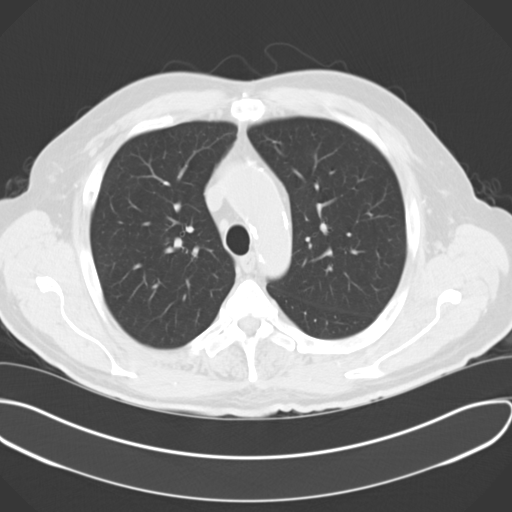
[im 50/64  lung]
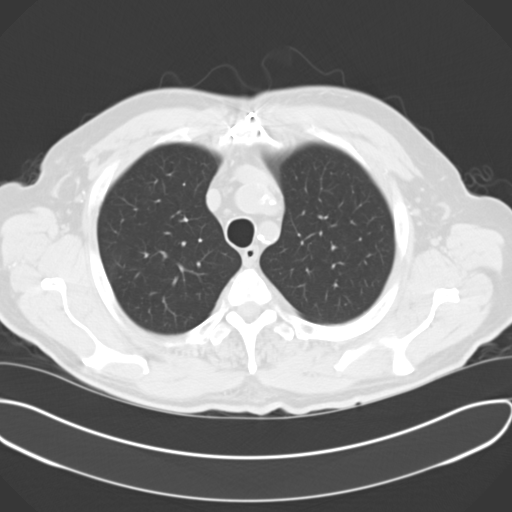
[im 52/64  mediastinal]
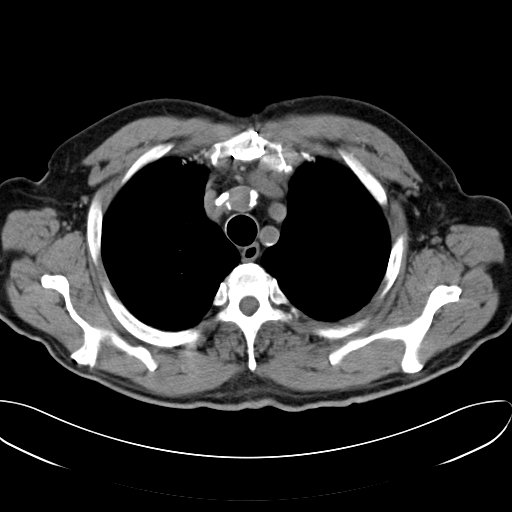
[im 52/64  lung]
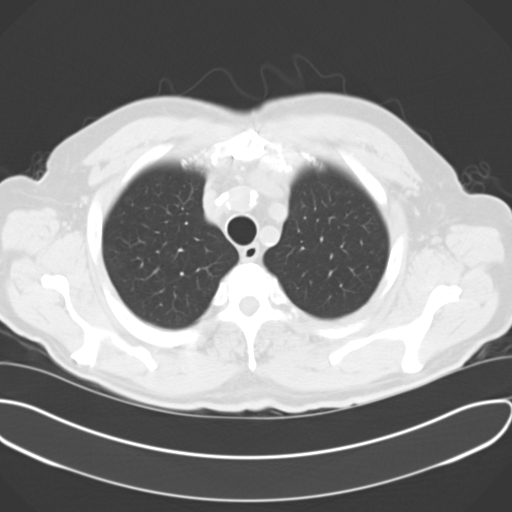
[im 57/64  lung]
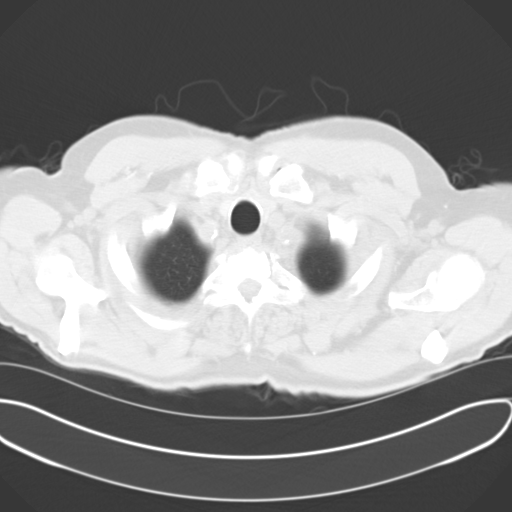
[im 61/64  lung]
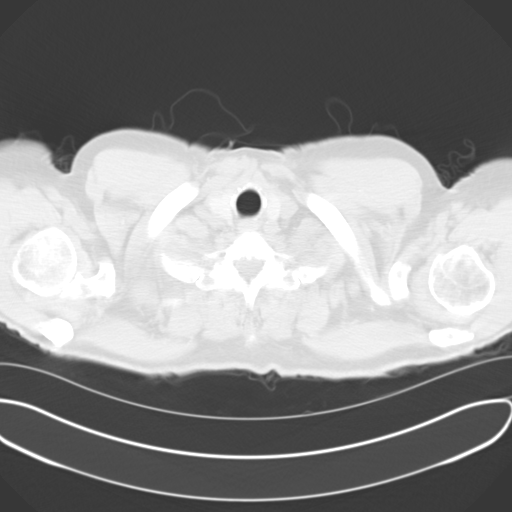

[15 of 31 positions shown; findings below may reference images not displayed]

FINDINGS: Heart: Significant calcification involving the mitral annulus.
Aortic valve replacement. Coronary artery calcifications are
extensive. Heart size is normal. No pericardial effusion.

Vascular structures: Extensive atherosclerotic calcification of the
thoracic arch and great vessels. Incidental note is made of bovine
aortic arch anatomy. The ascending aorta is aneurysmal, 4.5 cm in
diameter. Descending aorta is not aneurysmal.

Mediastinum/thyroid: The visualized portion of the thyroid gland has
a normal appearance. No mediastinal, hilar, or axillary adenopathy.

Lungs/Airways: No pulmonary nodules, pleural effusions, or
infiltrates.

Upper abdomen: Atherosclerotic calcification of the abdominal aorta.
Partially imaged periaortic lymph node as seen on exam earlier.

Chest wall/osseous structures: Degenerative changes are identified
at numerous levels in the thoracic spine. No suspicious lytic or
blastic lesions are identified.
IMPRESSION: 1. Ascending thoracic aortic aneurysm. Recommend semi-annual imaging
followup by CTA or MRA and referral to cardiothoracic surgery if not
already obtained. This recommendation follows 2686
ACCF/AHA/AATS/ACR/ASA/SCA/ROMEO/BEBESITO/SFIC/WUE Guidelines for the
Diagnosis and Management of Patients With Thoracic Aortic Disease.
Circulation. 2686; 121: [id number]
2. Extensive coronary artery calcification. Aortic valve
replacement.
3. No evidence for malignancy in the chest.
4. Partially imaged periaortic lymph node as seen on CT of the
abdomen and pelvis earlier today.

## 2016-09-25 ENCOUNTER — Emergency Department: Payer: No Typology Code available for payment source

## 2016-09-25 ENCOUNTER — Emergency Department
Admission: EM | Admit: 2016-09-25 | Discharge: 2016-09-25 | Disposition: A | Discharge status: Home / Self Care | Payer: No Typology Code available for payment source | Attending: Emergency Medicine | Admitting: Emergency Medicine

## 2016-09-25 ENCOUNTER — Encounter: Payer: Self-pay | Admitting: Emergency Medicine

## 2016-09-25 DIAGNOSIS — N183 Chronic kidney disease, stage 3 (moderate): Secondary | ICD-10-CM | POA: Diagnosis not present

## 2016-09-25 DIAGNOSIS — Z79899 Other long term (current) drug therapy: Secondary | ICD-10-CM | POA: Insufficient documentation

## 2016-09-25 DIAGNOSIS — I129 Hypertensive chronic kidney disease with stage 1 through stage 4 chronic kidney disease, or unspecified chronic kidney disease: Secondary | ICD-10-CM | POA: Diagnosis not present

## 2016-09-25 DIAGNOSIS — E1122 Type 2 diabetes mellitus with diabetic chronic kidney disease: Secondary | ICD-10-CM | POA: Diagnosis not present

## 2016-09-25 DIAGNOSIS — R0789 Other chest pain: Secondary | ICD-10-CM | POA: Insufficient documentation

## 2016-09-25 DIAGNOSIS — Y999 Unspecified external cause status: Secondary | ICD-10-CM | POA: Diagnosis not present

## 2016-09-25 DIAGNOSIS — Y92410 Unspecified street and highway as the place of occurrence of the external cause: Secondary | ICD-10-CM | POA: Diagnosis not present

## 2016-09-25 DIAGNOSIS — Y9389 Activity, other specified: Secondary | ICD-10-CM | POA: Diagnosis not present

## 2016-09-25 DIAGNOSIS — Z952 Presence of prosthetic heart valve: Secondary | ICD-10-CM | POA: Insufficient documentation

## 2016-09-25 DIAGNOSIS — Z7901 Long term (current) use of anticoagulants: Secondary | ICD-10-CM | POA: Diagnosis not present

## 2016-09-25 DIAGNOSIS — R51 Headache: Secondary | ICD-10-CM | POA: Insufficient documentation

## 2016-09-25 DIAGNOSIS — S0990XA Unspecified injury of head, initial encounter: Secondary | ICD-10-CM | POA: Diagnosis present

## 2016-09-25 HISTORY — DX: Presence of prosthetic heart valve: Z95.2

## 2016-09-25 NOTE — ED Triage Notes (Signed)
Pain in chest when he coughs.
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
## 2017-12-26 NOTE — Progress Notes (Signed)
Cardiology Office Note   Date:  12/27/2017   ID:  Billy Henderson, DOB November 06, 1945, MRN [id number]  PCP:  [facility] [place]  Cardiologist:   Calli Bashor [place], MD   Chief Complaint  Patient presents with  . Follow-up    NP.  [name] Hypertension      History of Present Illness: Hence Henderson is a 72 y.o. male who is seen at the request of Dr. Westley Chandler for evaluation of labile HTN. He has an extensive CV history. He is s/p AVR in 2001 with a St Jude mechanical prosthesis. He is on chronic coumadin. Apparently had ICH in April 2018 when INR got too high.  He has a chronic LBBB. He has a thoracic aortic aneurysm measuring 4.8 cm by patient report. He also reports some degree of carotid stenosis by doppler. According to records from the [place] he had an Echo in April 2018 showing normal LV function. Mild LVH, moderate LAE. AV prosthesis with peak velocity of 3.2 m/sec. Mean gradient of 22 mm Hg. Peak gradient 39 mm Hg. Holter done December 2018 showed brief runs of SVT rate 114. Noted some 2nd degree AV block but no symptoms. Nuclear stress test Oct 22, 2017 showed Normal perfusion. EF 38%.   In addition he has a history of syncope related to severe anemia. Found to have colon CA and left colectomy done. He has DM with proliferative retinopathy, neuropathy, and CKD stage IV. He has HTN and HLD>   More recently he has noted periods of labile HTN. Checks his BP twice a day. Sometimes BP will go up to 200-210. Other times in 150-160 range. Now on amlodipine and losartan.takes hydralazine prn systolic BP > 740. No dizziness, chest pain, SOB. Nonsmoker.     Past Medical History:  Diagnosis Date  . Carotid arterial disease ([place])   . Chronic kidney disease    stage IV  . Colon cancer ([place])   . Diabetes mellitus without complication ([name])   . Diabetic neuropathy ([name])   . Diabetic retinopathy ([name])   . Gout   . Heart valve replaced   . Hyperlipidemia   . Hypertension   . Intracranial  hemorrhage ([name])   . Iron deficiency anemia   . Kidney function abnormal   . LBBB (left bundle branch block)   . Low iron   . Nephrolithiasis   . Syncope   . Thoracic aortic aneurysm [facility])     Past Surgical History:  Procedure Laterality Date  . CARDIAC VALVE REPLACEMENT    . EYE SURGERY Right    Diabetic retinopathy   . PARTIAL COLECTOMY Left      Current Outpatient Medications  Medication Sig Dispense Refill  . allopurinol (ZYLOPRIM) 100 MG tablet Take 100 mg by mouth daily.    . Carvedilol (COREG PO) Take 25 mg by mouth 2 (two) times daily.     . furosemide (LASIX) 40 MG tablet Take 40 mg by mouth. TAKE 40 MG IN THE MORNING AND 20 MG AT NIGHT.    [name] losartan (COZAAR) 50 MG tablet Take 50 mg by mouth 2 (two) times daily.     . simvastatin (ZOCOR) 40 MG tablet Take 40 mg by mouth daily.    [name] VITAMIN D, ERGOCALCIFEROL, PO Take 1 tablet by mouth 2 (two) times daily.    [name] warfarin (COUMADIN) 4 MG tablet Take 3 mg by mouth daily. TAKE 3 MG EVERYDAY EXCEPT TAKE 4 MG ON SUNDAYS AND WEDNESDAYS.  No current facility-administered medications for this visit.     Allergies:   Lisinopril    Social History:  The patient  reports that he has never smoked. He has never used smokeless tobacco. He reports that he does not drink alcohol or use drugs.   Family History:  The patient's family history includes Clotting disorder in his mother; Diabetes in his father and unknown relative.    ROS:  Please see the history of present illness.   Otherwise, review of systems are positive for none.   All other systems are reviewed and negative.    PHYSICAL EXAM: VS:  BP 122/60 (BP Location: Left Arm, Patient Position: Sitting, Cuff Size: Normal)   Pulse (!) 58   Ht 5\' 6"  (1.676 m)   Wt 174 lb (78.9 kg)   BMI 28.08 kg/m  , BMI Body mass index is 28.08 kg/m. GEN: Well nourished, well developed, in no acute distress  HEENT: normal  Neck: no JVD, carotid bruits, or masses Cardiac: RRR; no  rubs, or gallops,no edema. Good mechanical AV click. No diastolic murmur. Soft SEM Respiratory:  clear to auscultation bilaterally, normal work of breathing GI: soft, nontender, nondistended, + BS MS: no deformity or atrophy  Skin: warm and dry, no rash Neuro:  Strength and sensation are intact Psych: euthymic mood, full affect   EKG:  EKG is ordered today. The ekg ordered today demonstrates Sinus brady with rate 58. LBBB. I have personally reviewed and interpreted this study.    Recent Labs: No results found for requested labs within last 8760 hours.    Lipid Panel No results found for: CHOL, TRIG, HDL, CHOLHDL, VLDL, LDLCALC, LDLDIRECT    Wt Readings from Last 3 Encounters:  12/27/17 174 lb (78.9 kg)  09/25/16 160 lb (72.6 kg)  07/21/16 165 lb (74.8 kg)      Other studies Reviewed: Additional studies/ records that were reviewed today include: as noted in HPI. Records from the [place].   Labs dated 11/16/17: BUN 45. Creatinine 3.2. Albumin 2.8. Other chemistries normal. Hgb 10. Dated 11/30/17: cholesterol 109, triglycerides 173, HDL 42, LDL 68. A1c 9.1%  ASSESSMENT AND PLAN:  1. Labile HTN. BP looks good today. BP readings equal in both arms. Will arrange for a 24 hour BP monitor at [facility] request. For now continue current therapy  2. S/p AVR with St. Jude mechanical prosthesis.  3. Thoracic ascending thoracic aneurysm. Followed at the [place].  4. LBBB  5. Diabetes mellitus on insulin with complications of CKD, retinopathy, neuropathy  6. Hypercholesterolemia. On statin.  7. Colon CA s/p partial colectomy  8. Iron deficiency anemia. Secondary to colon CA. Also component of anemia of CKD.   9. Carotid arterial disease  10. History of ICH with supratherapeutic INR.    Current medicines are reviewed at length with the patient today.  The patient does not have concerns regarding medicines.  The following changes have been made:  no change  Labs/ tests ordered today include:  24 hour BP monitor  Orders Placed This Encounter  Procedures  . Holter monitor - 24 hour  . EKG 12-Lead     Disposition:   FU TBD  Signed, Isiaih Hollenbach [place], MD  12/27/2017 12:11 PM    [facility] [address] [place], [place], [address] Phone [phone], Fax [phone]

## 2017-12-27 ENCOUNTER — Ambulatory Visit (INDEPENDENT_AMBULATORY_CARE_PROVIDER_SITE_OTHER): Payer: No Typology Code available for payment source | Admitting: Cardiology

## 2017-12-27 ENCOUNTER — Encounter: Payer: Self-pay | Admitting: Cardiology

## 2017-12-27 VITALS — BP 122/60 | HR 58 | Ht 66.0 in | Wt 174.0 lb

## 2017-12-27 DIAGNOSIS — I447 Left bundle-branch block, unspecified: Secondary | ICD-10-CM

## 2017-12-27 DIAGNOSIS — I10 Essential (primary) hypertension: Secondary | ICD-10-CM

## 2017-12-27 DIAGNOSIS — E113591 Type 2 diabetes mellitus with proliferative diabetic retinopathy without macular edema, right eye: Secondary | ICD-10-CM

## 2017-12-27 DIAGNOSIS — Z952 Presence of prosthetic heart valve: Secondary | ICD-10-CM

## 2017-12-27 DIAGNOSIS — Z794 Long term (current) use of insulin: Secondary | ICD-10-CM

## 2017-12-27 DIAGNOSIS — N184 Chronic kidney disease, stage 4 (severe): Secondary | ICD-10-CM

## 2017-12-27 DIAGNOSIS — I712 Thoracic aortic aneurysm, without rupture, unspecified: Secondary | ICD-10-CM

## 2017-12-27 NOTE — Patient Instructions (Signed)
Continue your current therapy  I will have you wear a 24 hour BP monitor.   We will request your records from the [place]

## 2017-12-29 ENCOUNTER — Telehealth: Payer: Self-pay | Admitting: Cardiology

## 2017-12-29 NOTE — Telephone Encounter (Signed)
Received incoming records from [facility]. Records placed in Dr. Doug Sou box. 12/29/17 ab

## 2018-01-11 ENCOUNTER — Ambulatory Visit (INDEPENDENT_AMBULATORY_CARE_PROVIDER_SITE_OTHER): Payer: Non-veteran care

## 2018-01-11 ENCOUNTER — Encounter: Payer: Self-pay | Admitting: *Deleted

## 2018-01-11 DIAGNOSIS — Z794 Long term (current) use of insulin: Secondary | ICD-10-CM

## 2018-01-11 DIAGNOSIS — I10 Essential (primary) hypertension: Secondary | ICD-10-CM

## 2018-01-11 DIAGNOSIS — I447 Left bundle-branch block, unspecified: Secondary | ICD-10-CM

## 2018-01-11 DIAGNOSIS — I712 Thoracic aortic aneurysm, without rupture, unspecified: Secondary | ICD-10-CM

## 2018-01-11 DIAGNOSIS — N184 Chronic kidney disease, stage 4 (severe): Secondary | ICD-10-CM

## 2018-01-11 DIAGNOSIS — E113591 Type 2 diabetes mellitus with proliferative diabetic retinopathy without macular edema, right eye: Secondary | ICD-10-CM

## 2018-01-11 DIAGNOSIS — Z952 Presence of prosthetic heart valve: Secondary | ICD-10-CM

## 2018-01-11 NOTE — Progress Notes (Signed)
Patient ID: Billy Henderson, male   DOB: 08-26-1946, 72 y.o.   MRN: [id number]  24 hour ambulatory blood pressure monitor applied to patient using standard adult cuff.

## 2018-01-26 ENCOUNTER — Telehealth: Payer: Self-pay | Admitting: Cardiology

## 2018-01-26 NOTE — Telephone Encounter (Signed)
Pt requesting to update pt's chart with pt's pcp name. Chart updated.

## 2018-01-26 NOTE — Telephone Encounter (Signed)
New Message   Patients wife is calling to provide the names of the doctors her husband sees at the [place]. PCP Name Brett Canales and the cardiologist is Richards but does not remember last name.

## 2018-01-28 ENCOUNTER — Telehealth: Payer: Self-pay | Admitting: Cardiology

## 2018-01-28 NOTE — Telephone Encounter (Signed)
New message    Patient spouse calling, requesting to speak with Malachy Mood. Please call

## 2018-02-01 NOTE — Telephone Encounter (Signed)
Returned call to patient's wife no answer.LMTC. 

## 2018-02-02 NOTE — Telephone Encounter (Signed)
Spoke to patient's wife she stated she already spoke to a triage RN and gave her the names of the [place] sees at [facility].Stated PCP is Dr.Donald Percell Miller.Cardiolgist Destiny she does not remember her last name.Stated he saw pharmacist there this past week and medication changed.Stated she will call back if needed.

## 2019-10-04 ENCOUNTER — Other Ambulatory Visit: Payer: Self-pay

## 2019-10-04 ENCOUNTER — Emergency Department: Payer: No Typology Code available for payment source

## 2019-10-04 DIAGNOSIS — N184 Chronic kidney disease, stage 4 (severe): Secondary | ICD-10-CM | POA: Insufficient documentation

## 2019-10-04 DIAGNOSIS — Z7901 Long term (current) use of anticoagulants: Secondary | ICD-10-CM | POA: Diagnosis not present

## 2019-10-04 DIAGNOSIS — Z79899 Other long term (current) drug therapy: Secondary | ICD-10-CM | POA: Insufficient documentation

## 2019-10-04 DIAGNOSIS — R112 Nausea with vomiting, unspecified: Secondary | ICD-10-CM | POA: Diagnosis present

## 2019-10-04 DIAGNOSIS — R531 Weakness: Secondary | ICD-10-CM | POA: Diagnosis not present

## 2019-10-04 DIAGNOSIS — Z85038 Personal history of other malignant neoplasm of large intestine: Secondary | ICD-10-CM | POA: Insufficient documentation

## 2019-10-04 DIAGNOSIS — E1122 Type 2 diabetes mellitus with diabetic chronic kidney disease: Secondary | ICD-10-CM | POA: Diagnosis not present

## 2019-10-04 DIAGNOSIS — I129 Hypertensive chronic kidney disease with stage 1 through stage 4 chronic kidney disease, or unspecified chronic kidney disease: Secondary | ICD-10-CM | POA: Insufficient documentation

## 2019-10-04 LAB — CBC
HCT: 24.8 % — ABNORMAL LOW (ref 39.0–52.0)
Hemoglobin: 8.2 g/dL — ABNORMAL LOW (ref 13.0–17.0)
MCH: 30.4 pg (ref 26.0–34.0)
MCHC: 33.1 g/dL (ref 30.0–36.0)
MCV: 91.9 fL (ref 80.0–100.0)
Platelets: 230 10*3/uL (ref 150–400)
RBC: 2.7 MIL/uL — ABNORMAL LOW (ref 4.22–5.81)
RDW: 13.6 % (ref 11.5–15.5)
WBC: 12.9 10*3/uL — ABNORMAL HIGH (ref 4.0–10.5)
nRBC: 0 % (ref 0.0–0.2)

## 2019-10-04 LAB — BASIC METABOLIC PANEL
Anion gap: 12 (ref 5–15)
BUN: 60 mg/dL — ABNORMAL HIGH (ref 8–23)
CO2: 19 mmol/L — ABNORMAL LOW (ref 22–32)
Calcium: 9.5 mg/dL (ref 8.9–10.3)
Chloride: 105 mmol/L (ref 98–111)
Creatinine, Ser: 6.9 mg/dL — ABNORMAL HIGH (ref 0.61–1.24)
GFR calc Af Amer: 8 mL/min — ABNORMAL LOW (ref >60)
GFR calc non Af Amer: 7 mL/min — ABNORMAL LOW (ref >60)
Glucose, Bld: 319 mg/dL — ABNORMAL HIGH (ref 70–99)
Potassium: 4.5 mmol/L (ref 3.5–5.1)
Sodium: 136 mmol/L (ref 135–145)

## 2019-10-04 LAB — TROPONIN I (HIGH SENSITIVITY)
Troponin I (High Sensitivity): 39 ng/L — ABNORMAL HIGH (ref <18)
Troponin I (High Sensitivity): 36 ng/L — ABNORMAL HIGH (ref <18)

## 2019-10-04 MED ORDER — SODIUM CHLORIDE 0.9% FLUSH: 3.0000 mL | Freq: Once | INTRAVENOUS | Status: DC

## 2019-10-04 NOTE — ED Triage Notes (Signed)
Pt presents via POV c/o weakness since Thursday. 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 D/c on 09/08/2019 per wife. Reports under hospice care. A&Ox4. Reports decreased appetite.

## 2019-10-04 NOTE — ED Triage Notes (Signed)
Wife also reports pt has intermittent confusion and is having visual hallucinations.

## 2019-10-05 ENCOUNTER — Emergency Department
Admission: EM | Admit: 2019-10-05 | Discharge: 2019-10-05 | Disposition: A | Discharge status: Home / Self Care | Payer: No Typology Code available for payment source | Attending: Student in an Organized Health Care Education/Training Program | Admitting: Student in an Organized Health Care Education/Training Program

## 2019-10-05 ENCOUNTER — Emergency Department: Payer: No Typology Code available for payment source

## 2019-10-05 DIAGNOSIS — N19 Unspecified kidney failure: Secondary | ICD-10-CM

## 2019-10-05 LAB — URINALYSIS, COMPLETE (UACMP) WITH MICROSCOPIC
Bilirubin Urine: NEGATIVE
Glucose, UA: >=500 mg/dL — AB
Hgb urine dipstick: NEGATIVE
Ketones, ur: NEGATIVE mg/dL
Nitrite: NEGATIVE
Protein, ur: 100 mg/dL — AB
Specific Gravity, Urine: 1.009 (ref 1.005–1.030)
WBC, UA: >50 WBC/hpf — ABNORMAL HIGH (ref 0–5)
pH: 5 (ref 5.0–8.0)

## 2019-10-05 MED ORDER — ONDANSETRON HCL 4 MG/2ML IJ SOLN
4.0000 mg | Freq: Once | INTRAMUSCULAR | Status: AC
  Administered 2019-10-05: 01:00:00 4 mg via INTRAVENOUS
  Filled 2019-10-05: qty 2

## 2019-10-05 MED ORDER — SODIUM CHLORIDE 0.9 % IV BOLUS
250.0000 mL | Freq: Once | INTRAVENOUS | Status: AC
  Administered 2019-10-05: 01:00:00 250 mL via INTRAVENOUS

## 2019-10-05 MED ORDER — SODIUM CHLORIDE 0.9 % IV SOLN
1.0000 g | Freq: Once | INTRAVENOUS | Status: AC
  Administered 2019-10-05: 1 g via INTRAVENOUS
  Filled 2019-10-05: qty 10

## 2019-10-05 MED ORDER — SODIUM CHLORIDE 0.9 % IV SOLN: Freq: Once | INTRAVENOUS | Status: AC

## 2019-10-05 MED ORDER — CEPHALEXIN 250 MG PO CAPS: 250.0000 mg | ORAL_CAPSULE | Freq: Two times a day (BID) | ORAL | 0 refills | Status: AC

## 2019-10-05 NOTE — ED Notes (Addendum)
Pt taken to CT/ Xray.

## 2019-10-05 NOTE — ED Provider Notes (Signed)
[facility] Emergency Department Provider Note    First MD Initiated Contact with Patient 10/05/19 0011     (approximate)  I have reviewed the triage vital signs and the nursing notes.   HISTORY  Chief Complaint Weakness    HPI Billy Henderson is a 74 y.o. male extensive past medical history as listed below presents the ER for intractable nausea vomiting.  States has been going on for the past several days patient becoming increasingly weak.  Denies any focal pain.  States he is still making urine.  States he was told after recently being discharged from the [place] in [place] that his kidneys were failing and was discharged on home hospice.  Was recently admitted for brainstem stroke.  Is having trouble keeping food and fluids down.  Denies any chest pain or shortness of breath.    Past Medical History:  Diagnosis Date  . Carotid arterial disease ([place])   . Chronic kidney disease    stage IV  . Colon cancer ([place])   . Diabetes mellitus without complication ([place])   . Diabetic neuropathy ([place])   . Diabetic retinopathy ([place])   . Gout   . Heart valve replaced   . Hyperlipidemia   . Hypertension   . Intracranial hemorrhage ([place])   . Iron deficiency anemia   . Kidney function abnormal   . LBBB (left bundle branch block)   . Low iron   . Nephrolithiasis   . Syncope   . Thoracic aortic aneurysm ([facility])    Family History  Problem Relation Age of Onset  . Diabetes Other   . Clotting disorder Mother   . Diabetes Father    Past Surgical History:  Procedure Laterality Date  . CARDIAC VALVE REPLACEMENT    . EYE SURGERY Right    Diabetic retinopathy   . PARTIAL COLECTOMY Left    Patient Active Problem List   Diagnosis Date Noted  . Intractable nausea and vomiting 06/10/2015  . CKD (chronic kidney disease), stage III 06/10/2015  . Leukocytosis 06/10/2015  . Hypertension 06/10/2015  . Type 2 diabetes mellitus ([place]) 06/10/2015      Prior to Admission  medications   Medication Sig Start Date End Date Taking? Authorizing Provider  allopurinol (ZYLOPRIM) 100 MG tablet Take 100 mg by mouth daily.    [provider]  Carvedilol (COREG PO) Take 25 mg by mouth 2 (two) times daily.     [provider]  cephALEXin (KEFLEX) 250 MG capsule Take 1 capsule (250 mg total) by mouth 2 (two) times daily for 5 days. 10/05/19 10/10/19  Merlyn Lot, MD  furosemide (LASIX) 40 MG tablet Take 40 mg by mouth. TAKE 40 MG IN THE MORNING AND 20 MG AT NIGHT.    [provider]  losartan (COZAAR) 50 MG tablet Take 50 mg by mouth 2 (two) times daily.     [provider]  simvastatin (ZOCOR) 40 MG tablet Take 40 mg by mouth daily.    [provider]  VITAMIN D, ERGOCALCIFEROL, PO Take 1 tablet by mouth 2 (two) times daily.    [provider]  warfarin (COUMADIN) 4 MG tablet Take 3 mg by mouth daily. TAKE 3 MG EVERYDAY EXCEPT TAKE 4 MG ON SUNDAYS AND WEDNESDAYS.    [provider]    Allergies Lisinopril    Social History Social History   Tobacco Use  . Smoking status: Never Smoker  . Smokeless tobacco: Never Used  Substance Use Topics  .  Alcohol use: No    Alcohol/week: 0.0 standard drinks  . Drug use: No    Review of Systems Patient denies headaches, rhinorrhea, blurry vision, numbness, shortness of breath, chest pain, edema, cough, abdominal pain, nausea, vomiting, diarrhea, dysuria, fevers, rashes or hallucinations unless otherwise stated above in HPI. ____________________________________________   PHYSICAL EXAM:  VITAL SIGNS: Vitals:   10/05/19 0120 10/05/19 0337  BP: (!) 140/56 (!) 148/76  Pulse: 74 74  Resp: 20 17  Temp:  98.8 F (37.1 C)  SpO2: 97% 94%    Constitutional: Alert and oriented.  Eyes: Conjunctivae are normal. Pale conjunctiva Head: Atraumatic. Nose: No congestion/rhinnorhea. Mouth/Throat: Mucous membranes are moist.   Neck: No stridor. Painless ROM.    Cardiovascular: Normal rate, regular rhythm. Grossly normal heart sounds.  Good peripheral circulation. Respiratory: Normal respiratory effort.  No retractions. Lungs CTAB. Gastrointestinal: Soft and nontender. No distention. No abdominal bruits. No CVA tenderness. Genitourinary:  Musculoskeletal: No lower extremity tenderness nor edema.  No joint effusions. Neurologic:  Normal speech and language. No gross focal neurologic deficits are appreciated. No facial droop Skin:  Skin is warm, dry and intact. No rash noted. Psychiatric: Mood and affect are normal. Speech and behavior are normal.  ____________________________________________   LABS (all labs ordered are listed, but only abnormal results are displayed)  Results for orders placed or performed during the hospital encounter of 10/05/19 (from the past 24 hour(s))  Basic metabolic panel     Status: Abnormal   Collection Time: 10/04/19  8:38 PM  Result Value Ref Range   Sodium 136 135 - 145 mmol/L   Potassium 4.5 3.5 - 5.1 mmol/L   Chloride 105 98 - 111 mmol/L   CO2 19 (L) 22 - 32 mmol/L   Glucose, Bld 319 (H) 70 - 99 mg/dL   BUN 60 (H) 8 - 23 mg/dL   Creatinine, Ser 6.90 (H) 0.61 - 1.24 mg/dL   Calcium 9.5 8.9 - 10.3 mg/dL   GFR calc non Af Amer 7 (L) >60 mL/min   GFR calc Af Amer 8 (L) >60 mL/min   Anion gap 12 5 - 15  CBC     Status: Abnormal   Collection Time: 10/04/19  8:38 PM  Result Value Ref Range   WBC 12.9 (H) 4.0 - 10.5 K/uL   RBC 2.70 (L) 4.22 - 5.81 MIL/uL   Hemoglobin 8.2 (L) 13.0 - 17.0 g/dL   HCT 24.8 (L) 39.0 - 52.0 %   MCV 91.9 80.0 - 100.0 fL   MCH 30.4 26.0 - 34.0 pg   MCHC 33.1 30.0 - 36.0 g/dL   RDW 13.6 11.5 - 15.5 %   Platelets 230 150 - 400 K/uL   nRBC 0.0 0.0 - 0.2 %  Troponin I (High Sensitivity)     Status: Abnormal   Collection Time: 10/04/19  8:43 PM  Result Value Ref Range   Troponin I (High Sensitivity) 39 (H) <18 ng/L  Troponin I (High Sensitivity)     Status: Abnormal   Collection  Time: 10/04/19 10:51 PM  Result Value Ref Range   Troponin I (High Sensitivity) 36 (H) <18 ng/L  Urinalysis, Complete w Microscopic     Status: Abnormal   Collection Time: 10/05/19  1:54 AM  Result Value Ref Range   Color, Urine YELLOW (A) YELLOW   APPearance TURBID (A) CLEAR   Specific Gravity, Urine 1.009 1.005 - 1.030   pH 5.0 5.0 - 8.0   Glucose, UA >=500 (A) NEGATIVE mg/dL  Hgb urine dipstick NEGATIVE NEGATIVE   Bilirubin Urine NEGATIVE NEGATIVE   Ketones, ur NEGATIVE NEGATIVE mg/dL   Protein, ur 100 (A) NEGATIVE mg/dL   Nitrite NEGATIVE NEGATIVE   Leukocytes,Ua LARGE (A) NEGATIVE   RBC / HPF 6-10 0 - 5 RBC/hpf   WBC, UA >50 (H) 0 - 5 WBC/hpf   Bacteria, UA FEW (A) NONE SEEN   Squamous Epithelial / LPF 0-5 0 - 5   WBC Clumps PRESENT    ____________________________________________  EKG My review and personal interpretation at Time: 20:36   Indication: weakness  Rate: 70  Rhythm: sinus with occasional pac Axis: left Other: lbbb, no stemi ____________________________________________  RADIOLOGY  I personally reviewed all radiographic images ordered to evaluate for the above acute complaints and reviewed radiology reports and findings.  These findings were personally discussed with the patient.  Please see medical record for radiology report.  ____________________________________________   PROCEDURES  Procedure(s) performed:  Procedures    Critical Care performed: no ____________________________________________   INITIAL IMPRESSION / ASSESSMENT AND PLAN / ED COURSE  Pertinent labs & imaging results that were available during my care of the patient were reviewed by me and considered in my medical decision making (see chart for details).   DDX: dehydration, electrolyte abn, anemia, sbo, colitis, gastritis, enteritis, arf  Billy Henderson is a 74 y.o. who presents to the ED with symptoms as described above.  Patient frail and chronically ill appearing.  We will  add on CT abdomen pelvis to evaluate for obstructive pattern.  Will give IV hydration.   Clinical Course as of Oct 04 612  Sherin Quarry  0221 Discussed results with patient.  His renal function does appear to have worsened as compared to discharge blood work from the [place] which the wife shows had a creatinine of 4.4.   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  Recommended inserting Foley catheter to decompress as this may be contributing to his renal disease.  Patient declined this.  It sounds like his goals of care are primarily comfort.  He is requesting discharge home.  He is already established care with hospice.  Encourage patient and however bedside to return if they change their mind.  Have discussed with the patient and available family all diagnostics and treatments performed thus far and all questions were answered to the best of my ability. The patient demonstrates understanding and agreement with plan.    [PR]    Clinical Course User Index [PR] Merlyn Lot, MD    The patient was evaluated in Emergency Department today for the symptoms described in the history of present illness. He/she was evaluated in the context of the global COVID-19 pandemic, which necessitated consideration that the patient might be at risk for infection with the SARS-CoV-2 virus that causes COVID-19. Institutional protocols and algorithms that pertain to the evaluation of patients at risk for COVID-19 are in a state of rapid change based on information released by regulatory bodies including the CDC and federal and state organizations. These policies and algorithms were followed during the patient's care in the ED.  As part of my medical decision making, I reviewed the following data within the [facility] notes reviewed and incorporated, Labs reviewed, notes from prior ED visits and [place] Controlled Substance  Database   ____________________________________________   FINAL CLINICAL IMPRESSION(S) / ED DIAGNOSES  Final diagnoses:  Renal failure, unspecified chronicity  NEW MEDICATIONS STARTED DURING THIS VISIT:  Discharge Medication List as of 10/05/2019  2:32 AM    START taking these medications   Details  cephALEXin (KEFLEX) 250 MG capsule Take 1 capsule (250 mg total) by mouth 2 (two) times daily for 5 days., Starting Thu 10/05/2019, Until Tue 10/10/2019, Normal         Note:  This document was prepared using Dragon voice recognition software and may include unintentional dictation errors.    Merlyn Lot, MD 10/05/19 [phone]

## 2019-10-23 DEATH — deceased
